# Patient Record
Sex: Male | Born: 1940 | Race: Black or African American | Hispanic: No | Smoking: Former smoker
Health system: Southern US, Community
[De-identification: ages and names within clinical notes are randomized; demographics above are authoritative.]

## PROBLEM LIST (undated history)

## (undated) DIAGNOSIS — D751 Secondary polycythemia: Secondary | ICD-10-CM

## (undated) DIAGNOSIS — I428 Other cardiomyopathies: Secondary | ICD-10-CM

## (undated) DIAGNOSIS — I639 Cerebral infarction, unspecified: Secondary | ICD-10-CM

## (undated) DIAGNOSIS — Z720 Tobacco use: Secondary | ICD-10-CM

## (undated) DIAGNOSIS — F1910 Other psychoactive substance abuse, uncomplicated: Secondary | ICD-10-CM

## (undated) DIAGNOSIS — E875 Hyperkalemia: Secondary | ICD-10-CM

## (undated) DIAGNOSIS — I519 Heart disease, unspecified: Secondary | ICD-10-CM

## (undated) DIAGNOSIS — I499 Cardiac arrhythmia, unspecified: Secondary | ICD-10-CM

## (undated) DIAGNOSIS — I340 Nonrheumatic mitral (valve) insufficiency: Secondary | ICD-10-CM

## (undated) DIAGNOSIS — J189 Pneumonia, unspecified organism: Secondary | ICD-10-CM

## (undated) DIAGNOSIS — F1921 Other psychoactive substance dependence, in remission: Secondary | ICD-10-CM

## (undated) DIAGNOSIS — R9430 Abnormal result of cardiovascular function study, unspecified: Secondary | ICD-10-CM

## (undated) DIAGNOSIS — IMO0002 Reserved for concepts with insufficient information to code with codable children: Secondary | ICD-10-CM

## (undated) DIAGNOSIS — I5022 Chronic systolic (congestive) heart failure: Secondary | ICD-10-CM

## (undated) DIAGNOSIS — E8770 Fluid overload, unspecified: Secondary | ICD-10-CM

## (undated) HISTORY — DX: Secondary polycythemia: D75.1

## (undated) HISTORY — DX: Other psychoactive substance dependence, in remission: F19.21

## (undated) HISTORY — DX: Fluid overload, unspecified: E87.70

## (undated) HISTORY — DX: Chronic systolic (congestive) heart failure: I50.22

## (undated) HISTORY — PX: OTHER SURGICAL HISTORY: SHX169

## (undated) HISTORY — DX: Pneumonia, unspecified organism: J18.9

## (undated) HISTORY — PX: COLONOSCOPY: SHX174

## (undated) HISTORY — PX: HERNIA REPAIR: SHX51

## (undated) HISTORY — DX: Reserved for concepts with insufficient information to code with codable children: IMO0002

## (undated) HISTORY — DX: Other psychoactive substance abuse, uncomplicated: F19.10

## (undated) HISTORY — DX: Nonrheumatic mitral (valve) insufficiency: I34.0

## (undated) HISTORY — DX: Tobacco use: Z72.0

## (undated) HISTORY — DX: Abnormal result of cardiovascular function study, unspecified: R94.30

## (undated) HISTORY — DX: Heart disease, unspecified: I51.9

---

## 2000-04-17 DIAGNOSIS — J189 Pneumonia, unspecified organism: Secondary | ICD-10-CM

## 2000-04-17 HISTORY — DX: Pneumonia, unspecified organism: J18.9

## 2000-04-28 ENCOUNTER — Emergency Department (HOSPITAL_COMMUNITY): Admission: EM | Admit: 2000-04-28 | Discharge: 2000-04-28 | Payer: Self-pay | Admitting: Emergency Medicine

## 2000-04-28 ENCOUNTER — Encounter: Payer: Self-pay | Admitting: Emergency Medicine

## 2000-05-05 ENCOUNTER — Encounter: Payer: Self-pay | Admitting: Emergency Medicine

## 2000-05-05 ENCOUNTER — Inpatient Hospital Stay (HOSPITAL_COMMUNITY): Admission: EM | Admit: 2000-05-05 | Discharge: 2000-05-07 | Payer: Self-pay | Admitting: Emergency Medicine

## 2000-05-29 ENCOUNTER — Encounter: Admission: RE | Admit: 2000-05-29 | Discharge: 2000-05-29 | Payer: Self-pay | Admitting: Family Medicine

## 2000-06-24 ENCOUNTER — Encounter: Payer: Self-pay | Admitting: General Surgery

## 2000-06-26 ENCOUNTER — Encounter (INDEPENDENT_AMBULATORY_CARE_PROVIDER_SITE_OTHER): Payer: Self-pay | Admitting: Specialist

## 2000-06-26 ENCOUNTER — Observation Stay (HOSPITAL_COMMUNITY): Admission: RE | Admit: 2000-06-26 | Discharge: 2000-06-27 | Payer: Self-pay | Admitting: General Surgery

## 2000-08-27 ENCOUNTER — Ambulatory Visit (HOSPITAL_COMMUNITY): Admission: RE | Admit: 2000-08-27 | Discharge: 2000-08-27 | Payer: Self-pay | Admitting: *Deleted

## 2002-08-06 ENCOUNTER — Encounter: Payer: Self-pay | Admitting: Family Medicine

## 2002-08-06 ENCOUNTER — Ambulatory Visit (HOSPITAL_COMMUNITY): Admission: RE | Admit: 2002-08-06 | Discharge: 2002-08-06 | Payer: Self-pay | Admitting: Family Medicine

## 2003-05-07 ENCOUNTER — Inpatient Hospital Stay (HOSPITAL_COMMUNITY): Admission: EM | Admit: 2003-05-07 | Discharge: 2003-05-08 | Payer: Self-pay | Admitting: Emergency Medicine

## 2003-06-08 ENCOUNTER — Ambulatory Visit (HOSPITAL_COMMUNITY): Admission: RE | Admit: 2003-06-08 | Discharge: 2003-06-08 | Payer: Self-pay | Admitting: Internal Medicine

## 2004-02-01 ENCOUNTER — Encounter: Admission: RE | Admit: 2004-02-01 | Discharge: 2004-02-01 | Payer: Self-pay | Admitting: General Surgery

## 2004-02-02 ENCOUNTER — Ambulatory Visit (HOSPITAL_BASED_OUTPATIENT_CLINIC_OR_DEPARTMENT_OTHER): Admission: RE | Admit: 2004-02-02 | Discharge: 2004-02-02 | Payer: Self-pay | Admitting: General Surgery

## 2004-02-02 ENCOUNTER — Ambulatory Visit (HOSPITAL_COMMUNITY): Admission: RE | Admit: 2004-02-02 | Discharge: 2004-02-02 | Payer: Self-pay | Admitting: General Surgery

## 2004-02-21 ENCOUNTER — Ambulatory Visit: Payer: Self-pay | Admitting: *Deleted

## 2004-02-27 ENCOUNTER — Ambulatory Visit: Payer: Self-pay | Admitting: Nurse Practitioner

## 2004-04-23 ENCOUNTER — Ambulatory Visit: Payer: Self-pay | Admitting: Nurse Practitioner

## 2004-11-28 ENCOUNTER — Ambulatory Visit: Payer: Self-pay | Admitting: Cardiology

## 2004-12-24 ENCOUNTER — Ambulatory Visit: Payer: Self-pay

## 2005-01-01 ENCOUNTER — Ambulatory Visit: Payer: Self-pay | Admitting: Cardiology

## 2005-01-16 ENCOUNTER — Ambulatory Visit: Payer: Self-pay | Admitting: Internal Medicine

## 2005-02-04 ENCOUNTER — Ambulatory Visit: Payer: Self-pay | Admitting: Cardiology

## 2005-03-13 ENCOUNTER — Ambulatory Visit: Payer: Self-pay | Admitting: Cardiology

## 2005-04-11 ENCOUNTER — Ambulatory Visit: Payer: Self-pay | Admitting: Cardiology

## 2005-07-11 ENCOUNTER — Ambulatory Visit: Payer: Self-pay | Admitting: Cardiology

## 2005-11-07 ENCOUNTER — Ambulatory Visit: Payer: Self-pay | Admitting: Cardiology

## 2005-12-16 ENCOUNTER — Encounter: Payer: Self-pay | Admitting: Cardiology

## 2005-12-16 ENCOUNTER — Ambulatory Visit: Payer: Self-pay

## 2006-01-15 ENCOUNTER — Ambulatory Visit: Payer: Self-pay | Admitting: Cardiology

## 2006-02-07 ENCOUNTER — Ambulatory Visit: Payer: Self-pay | Admitting: Internal Medicine

## 2006-07-17 ENCOUNTER — Encounter: Admission: RE | Admit: 2006-07-17 | Discharge: 2006-07-17 | Payer: Self-pay | Admitting: Internal Medicine

## 2006-07-22 ENCOUNTER — Ambulatory Visit: Payer: Self-pay | Admitting: Internal Medicine

## 2006-07-28 ENCOUNTER — Ambulatory Visit: Payer: Self-pay | Admitting: Cardiology

## 2006-08-26 ENCOUNTER — Encounter (INDEPENDENT_AMBULATORY_CARE_PROVIDER_SITE_OTHER): Payer: Self-pay | Admitting: Specialist

## 2006-08-26 ENCOUNTER — Ambulatory Visit: Payer: Self-pay | Admitting: Internal Medicine

## 2006-10-19 ENCOUNTER — Inpatient Hospital Stay (HOSPITAL_COMMUNITY): Admission: EM | Admit: 2006-10-19 | Discharge: 2006-10-21 | Payer: Self-pay | Admitting: Emergency Medicine

## 2006-10-19 ENCOUNTER — Ambulatory Visit: Payer: Self-pay | Admitting: Internal Medicine

## 2007-01-16 ENCOUNTER — Ambulatory Visit: Payer: Self-pay | Admitting: Hematology & Oncology

## 2007-02-12 LAB — CBC WITH DIFFERENTIAL/PLATELET
BASO%: 0.6 % (ref 0.0–2.0)
Basophils Absolute: 0 10*3/uL (ref 0.0–0.1)
EOS%: 0.9 % (ref 0.0–7.0)
Eosinophils Absolute: 0 10*3/uL (ref 0.0–0.5)
HCT: 54.7 % — ABNORMAL HIGH (ref 38.7–49.9)
HGB: 18.8 g/dL — ABNORMAL HIGH (ref 13.0–17.1)
LYMPH%: 32.9 % (ref 14.0–48.0)
MCH: 29.6 pg (ref 28.0–33.4)
MCHC: 34.4 g/dL (ref 32.0–35.9)
MCV: 86.1 fL (ref 81.6–98.0)
MONO#: 0.7 10*3/uL (ref 0.1–0.9)
MONO%: 15.2 % — ABNORMAL HIGH (ref 0.0–13.0)
NEUT#: 2.2 10*3/uL (ref 1.5–6.5)
NEUT%: 50.4 % (ref 40.0–75.0)
Platelets: 165 10*3/uL (ref 145–400)
RBC: 6.36 10*6/uL — ABNORMAL HIGH (ref 4.20–5.71)
RDW: 15.7 % — ABNORMAL HIGH (ref 11.2–14.6)
WBC: 4.4 10*3/uL (ref 4.0–10.0)
lymph#: 1.5 10*3/uL (ref 0.9–3.3)

## 2007-02-12 LAB — CHCC SMEAR

## 2007-02-12 LAB — COMPREHENSIVE METABOLIC PANEL
BUN: 15 mg/dL (ref 6–23)
CO2: 26 mEq/L (ref 19–32)
Calcium: 9.4 mg/dL (ref 8.4–10.5)
Chloride: 103 mEq/L (ref 96–112)
Creatinine, Ser: 1.17 mg/dL (ref 0.40–1.50)
Glucose, Bld: 89 mg/dL (ref 70–99)

## 2007-02-18 LAB — JAK2 GENOTYPR

## 2007-02-18 LAB — HEMOGLOBINOPATHY EVALUATION
Hemoglobin Other: 0 % (ref 0.0–0.0)
Hgb A2 Quant: 2.4 % (ref 2.2–3.2)
Hgb A: 97.6 % (ref 96.8–97.8)
Hgb F Quant: 0 % (ref 0.0–2.0)
Hgb S Quant: 0 % (ref 0.0–0.0)

## 2007-02-18 LAB — VITAMIN B12: Vitamin B-12: 390 pg/mL (ref 211–911)

## 2007-02-18 LAB — FERRITIN: Ferritin: 359 ng/mL — ABNORMAL HIGH (ref 22–322)

## 2007-02-18 LAB — ERYTHROPOIETIN: Erythropoietin: 30.9 m[IU]/mL (ref 2.6–34.0)

## 2007-02-19 LAB — CBC WITH DIFFERENTIAL/PLATELET
Basophils Absolute: 0 10*3/uL (ref 0.0–0.1)
HCT: 53.1 % — ABNORMAL HIGH (ref 38.7–49.9)
HGB: 18.1 g/dL — ABNORMAL HIGH (ref 13.0–17.1)
MONO#: 0.8 10*3/uL (ref 0.1–0.9)
NEUT#: 2.2 10*3/uL (ref 1.5–6.5)
NEUT%: 47.5 % (ref 40.0–75.0)
RDW: 15.3 % — ABNORMAL HIGH (ref 11.2–14.6)
WBC: 4.6 10*3/uL (ref 4.0–10.0)
lymph#: 1.6 10*3/uL (ref 0.9–3.3)

## 2007-02-26 LAB — CBC WITH DIFFERENTIAL/PLATELET
Basophils Absolute: 0 10*3/uL (ref 0.0–0.1)
EOS%: 0.6 % (ref 0.0–7.0)
Eosinophils Absolute: 0 10*3/uL (ref 0.0–0.5)
HCT: 49.8 % (ref 38.7–49.9)
HGB: 17.2 g/dL — ABNORMAL HIGH (ref 13.0–17.1)
MCH: 29.9 pg (ref 28.0–33.4)
MCV: 86.8 fL (ref 81.6–98.0)
MONO%: 16.9 % — ABNORMAL HIGH (ref 0.0–13.0)
NEUT#: 1.9 10*3/uL (ref 1.5–6.5)
NEUT%: 45.8 % (ref 40.0–75.0)
lymph#: 1.5 10*3/uL (ref 0.9–3.3)

## 2007-03-11 ENCOUNTER — Ambulatory Visit: Payer: Self-pay | Admitting: Hematology & Oncology

## 2007-03-13 LAB — CBC WITH DIFFERENTIAL/PLATELET
Basophils Absolute: 0 10*3/uL (ref 0.0–0.1)
EOS%: 1.1 % (ref 0.0–7.0)
Eosinophils Absolute: 0.1 10*3/uL (ref 0.0–0.5)
HGB: 17.5 g/dL — ABNORMAL HIGH (ref 13.0–17.1)
LYMPH%: 35.8 % (ref 14.0–48.0)
MCH: 29.2 pg (ref 28.0–33.4)
MCV: 86.9 fL (ref 81.6–98.0)
MONO%: 19.9 % — ABNORMAL HIGH (ref 0.0–13.0)
NEUT#: 2.1 10*3/uL (ref 1.5–6.5)
Platelets: 187 10*3/uL (ref 145–400)
RDW: 15.8 % — ABNORMAL HIGH (ref 11.2–14.6)

## 2007-05-10 ENCOUNTER — Ambulatory Visit: Payer: Self-pay | Admitting: Hematology & Oncology

## 2007-05-13 LAB — COMPREHENSIVE METABOLIC PANEL
AST: 25 U/L (ref 0–37)
Albumin: 4.2 g/dL (ref 3.5–5.2)
Alkaline Phosphatase: 65 U/L (ref 39–117)
Glucose, Bld: 83 mg/dL (ref 70–99)
Potassium: 4.7 mEq/L (ref 3.5–5.3)
Sodium: 140 mEq/L (ref 135–145)
Total Bilirubin: 0.8 mg/dL (ref 0.3–1.2)
Total Protein: 7.3 g/dL (ref 6.0–8.3)

## 2007-05-13 LAB — CBC WITH DIFFERENTIAL/PLATELET
BASO%: 0.6 % (ref 0.0–2.0)
EOS%: 0.8 % (ref 0.0–7.0)
LYMPH%: 34.2 % (ref 14.0–48.0)
MCH: 29.2 pg (ref 28.0–33.4)
MCHC: 33.5 g/dL (ref 32.0–35.9)
MCV: 87.2 fL (ref 81.6–98.0)
MONO%: 14.9 % — ABNORMAL HIGH (ref 0.0–13.0)
NEUT#: 2.3 10*3/uL (ref 1.5–6.5)
Platelets: 211 10*3/uL (ref 145–400)
RBC: 6.56 10*6/uL — ABNORMAL HIGH (ref 4.20–5.71)
RDW: 16.5 % — ABNORMAL HIGH (ref 11.2–14.6)

## 2007-05-13 LAB — FERRITIN: Ferritin: 117 ng/mL (ref 22–322)

## 2007-11-26 ENCOUNTER — Ambulatory Visit: Payer: Self-pay | Admitting: Cardiology

## 2007-12-04 ENCOUNTER — Ambulatory Visit: Payer: Self-pay | Admitting: Hematology & Oncology

## 2007-12-04 LAB — CBC WITH DIFFERENTIAL/PLATELET
BASO%: 1.4 % (ref 0.0–2.0)
Basophils Absolute: 0.1 10*3/uL (ref 0.0–0.1)
EOS%: 0.7 % (ref 0.0–7.0)
Eosinophils Absolute: 0 10*3/uL (ref 0.0–0.5)
HCT: 55.4 % — ABNORMAL HIGH (ref 38.7–49.9)
HGB: 18.5 g/dL — ABNORMAL HIGH (ref 13.0–17.1)
LYMPH%: 32.5 % (ref 14.0–48.0)
MCH: 28.9 pg (ref 28.0–33.4)
MCHC: 33.4 g/dL (ref 32.0–35.9)
MCV: 86.5 fL (ref 81.6–98.0)
MONO#: 0.7 10*3/uL (ref 0.1–0.9)
MONO%: 15.9 % — ABNORMAL HIGH (ref 0.0–13.0)
NEUT#: 2.3 10*3/uL (ref 1.5–6.5)
NEUT%: 49.5 % (ref 40.0–75.0)
Platelets: 161 10*3/uL (ref 145–400)
RBC: 6.41 10*6/uL — ABNORMAL HIGH (ref 4.20–5.71)
RDW: 16.2 % — ABNORMAL HIGH (ref 11.2–14.6)
WBC: 4.6 10*3/uL (ref 4.0–10.0)
lymph#: 1.5 10*3/uL (ref 0.9–3.3)

## 2007-12-04 LAB — COMPREHENSIVE METABOLIC PANEL
ALT: 17 U/L (ref 0–53)
AST: 19 U/L (ref 0–37)
Albumin: 3.9 g/dL (ref 3.5–5.2)
Alkaline Phosphatase: 56 U/L (ref 39–117)
BUN: 13 mg/dL (ref 6–23)
CO2: 25 mEq/L (ref 19–32)
Calcium: 9.6 mg/dL (ref 8.4–10.5)
Chloride: 103 mEq/L (ref 96–112)
Creatinine, Ser: 0.91 mg/dL (ref 0.40–1.50)
Glucose, Bld: 99 mg/dL (ref 70–99)
Potassium: 4.1 mEq/L (ref 3.5–5.3)
Sodium: 139 mEq/L (ref 135–145)
Total Bilirubin: 0.7 mg/dL (ref 0.3–1.2)
Total Protein: 6.9 g/dL (ref 6.0–8.3)

## 2007-12-04 LAB — CHCC SMEAR

## 2007-12-07 ENCOUNTER — Encounter: Payer: Self-pay | Admitting: Cardiology

## 2007-12-07 ENCOUNTER — Ambulatory Visit: Payer: Self-pay

## 2008-01-25 ENCOUNTER — Ambulatory Visit: Payer: Self-pay | Admitting: Hematology

## 2008-04-24 ENCOUNTER — Ambulatory Visit: Payer: Self-pay | Admitting: Cardiovascular Disease

## 2008-04-24 ENCOUNTER — Observation Stay (HOSPITAL_COMMUNITY): Admission: EM | Admit: 2008-04-24 | Discharge: 2008-04-25 | Payer: Self-pay | Admitting: Emergency Medicine

## 2008-05-09 ENCOUNTER — Ambulatory Visit: Payer: Self-pay | Admitting: Cardiology

## 2008-11-08 ENCOUNTER — Encounter: Payer: Self-pay | Admitting: Cardiology

## 2008-11-09 ENCOUNTER — Encounter: Payer: Self-pay | Admitting: Cardiology

## 2008-12-15 ENCOUNTER — Telehealth: Payer: Self-pay | Admitting: Cardiology

## 2009-01-10 ENCOUNTER — Encounter (INDEPENDENT_AMBULATORY_CARE_PROVIDER_SITE_OTHER): Payer: Self-pay | Admitting: *Deleted

## 2009-02-21 ENCOUNTER — Encounter: Payer: Self-pay | Admitting: Cardiology

## 2009-04-13 ENCOUNTER — Encounter: Payer: Self-pay | Admitting: Cardiology

## 2009-04-14 ENCOUNTER — Ambulatory Visit: Payer: Self-pay | Admitting: Cardiology

## 2009-04-14 DIAGNOSIS — D751 Secondary polycythemia: Secondary | ICD-10-CM

## 2009-08-09 ENCOUNTER — Encounter (INDEPENDENT_AMBULATORY_CARE_PROVIDER_SITE_OTHER): Payer: Self-pay | Admitting: *Deleted

## 2009-09-27 ENCOUNTER — Ambulatory Visit: Payer: Self-pay | Admitting: Cardiology

## 2010-04-19 ENCOUNTER — Ambulatory Visit: Payer: Self-pay | Admitting: Cardiology

## 2010-04-19 ENCOUNTER — Encounter: Payer: Self-pay | Admitting: Cardiology

## 2010-07-17 NOTE — Assessment & Plan Note (Signed)
Summary: f52m   Visit Type:  Follow-up Primary Provider:  Kari Baars, MD  CC:  cardiomyopathy.  History of Present Illness: The patient is seen for followup of cardiomyopathy.  We know from the past he has severe left ventricular dysfunction.  Despite this he functions well and he feels well.  Over time I have made it clear to him that ICD placement would be recommended.  He has not wanted.  He has been willing to take most of the medications recommended.  I considered adding spironolactone.  In his case I have chosen not to because he does not want extra medicines added unless I'm sure that he needs them.  He is not having any significant symptoms.  Current Medications (verified): 1)  Carvedilol 25 Mg Tabs (Carvedilol) .... Take One Tablet By Mouth Twice A Day 2)  Fosinopril Sodium 40 Mg Tabs (Fosinopril Sodium) .... Take 1 Tablet By Mouth Once A Day 3)  Crestor 40 Mg Tabs (Rosuvastatin Calcium) .... Take One Tablet By Mouth Daily. 4)  Veramyst 27.5 Mcg/spray Susp (Fluticasone Furoate) .... Uad 5)  Aspirin 81 Mg Tbec (Aspirin) .... Take One Tablet By Mouth Daily 6)  Furosemide 40 Mg Tabs (Furosemide) .... 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  All other systems are reviewed and are negative.  Vital Signs:  Patient profile:   70 year old male Height:      71 inches Weight:      160 pounds BMI:     22.40 Pulse rate:   65 / minute BP  sitting:   122 / 70  (left arm) Cuff size:   regular  Vitals Entered By: Hardin Negus, RMA (September 27, 2009 1:58 PM)  Physical Exam  General:  patient is stable. Eyes:  no xanthelasma. Neck:  no jugular venous distention. Lungs:  lungs are clear respiratory effort is unlabored. Heart:  cardiac exam reveals S1-S2.  No clicks or significant murmurs. Abdomen:  abdomen is soft. Extremities:  no peripheral edema. Psych:  patient is oriented to person time and place.  Affect is normal.   Impression & Recommendations:  Problem # 1:  TOBACCO ABUSE (ICD-305.1) The patient continues to smoke.  I have counseled him and encouraged him to stop.  Problem # 2:  * RV DYSFUNCTION There is history of right ventricular dysfunction.  He does not have any physical signs related to this.  Problem # 3:  CARDIOMYOPATHY (ICD-425.4)  His updated medication list for this problem includes:    Carvedilol 25 Mg Tabs (Carvedilol) .Marland Kitchen... Take one tablet by mouth twice a day    Fosinopril Sodium 40 Mg Tabs (Fosinopril sodium) .Marland Kitchen... Take 1 tablet by mouth once a day    Aspirin 81 Mg Tbec (Aspirin) .Marland Kitchen... Take one tablet by mouth daily    Furosemide 40 Mg Tabs (Furosemide) .Marland Kitchen... Take one tablet by mouth two times a day The patient has significant cardiomyopathy.  He is on a good dose of carvedilol and ACE inhibitor.  He is on  furosemide and his volume remained stable.  There is no indication for digoxin.  ICD is recommended but he is not interested.  Spironolactone could clearly be considered but he is not interested in any extra meds.  He is doing very well.  No further workup at this time.  Patient Instructions: 1)  Follow up in 6 months

## 2010-07-17 NOTE — Letter (Signed)
Summary: Appointment - Reminder 2  [facility] [employer], Main Office  [address]   [place], [place] [address]   Phone: [phone]  Fax: [phone]     August 09, 2009 MRN: [id number]   [facility] [address] [place], [place]  [address]   Dear [name],  Our records indicate that it is time to schedule a follow-up appointment with Dr. Myrtis Ser. It is very important that we reach you to schedule this appointment. We look forward to participating in your health care needs. Please contact us at the number listed above at your earliest convenience to schedule your appointment.  If you are unable to make an appointment at this time, give [place] a call so we can update our records.     Sincerely,   Migdalia Dk [facility] Scheduling Team

## 2010-07-17 NOTE — Assessment & Plan Note (Signed)
Summary: f20m   Visit Type:  Follow-up Primary Tracey Stewart:  Kari Baars, MD  CC:  cardiomyopathy.  History of Present Illness: The patient is seen for followup of cardiomyopathy.  He has severe left ventricular dysfunction.  Fortunately he remains very stable.  He has no significant symptoms of heart failure.  He has no palpitations.  There's been no syncope or presyncope.  Current Medications (verified): 1)  Carvedilol 25 Mg Tabs (Carvedilol) .... Take One Tablet By Mouth Twice A Day 2)  Fosinopril Sodium 40 Mg Tabs (Fosinopril Sodium) .... Take 1 Tablet By Mouth Once A Day 3)  Crestor 40 Mg Tabs (Rosuvastatin Calcium) .... Take One Tablet By Mouth Daily. 4)  Veramyst 27.5 Mcg/spray Susp (Fluticasone Furoate) .... Uad 5)  Aspirin 81 Mg Tbec (Aspirin) .... Take One Tablet By Mouth Daily 6)  Furosemide 40 Mg Tabs (Furosemide) .... 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  Review of Systems       Patient denies fever, chills, headache, sweats, rash, change in vision, change in hearing, chest pain, cough, nausea vomiting, urinary symptoms.  All other systems are reviewed and are negative.  Vital Signs:  Patient profile:   70 year old male Height:      71 inches Weight:      156 pounds BMI:     21.84 Pulse rate:   68 / minute BP sitting:   108 / 64  (left arm) Cuff size:   regular  Vitals Entered By: Hardin Negus, RMA (April 19, 2010 9:49 AM)  Physical Exam  General:  patient is stable. Eyes:  no xanthelasma. Neck:  no jugular venous distention. Lungs:  lungs are clear.  Respiratory effort is not labored. Heart:  cardiac exam reveals S1-S2  no clicks or murmurs. Abdomen:  abdomen is soft. Extremities:  no peripheral edema. Psych:  patient is oriented to person time and place.  Affect is normal.   Impression & Recommendations:  Problem # 1:  TOBACCO ABUSE (ICD-305.1) I counseled the patient to stop.  Problem # 2:  CARDIOMYOPATHY (ICD-425.4)  His updated medication list for this problem includes:    Carvedilol 25 Mg Tabs (Carvedilol) .Marland Kitchen... Take one tablet by mouth twice a day    Fosinopril Sodium 40 Mg Tabs (Fosinopril sodium) .Marland Kitchen... Take 1 tablet by mouth once a day    Aspirin 81 Mg Tbec (Aspirin) .Marland Kitchen... Take one tablet by mouth daily    Furosemide 40 Mg Tabs (Furosemide) .Marland Kitchen... Take one tablet by mouth two times a day  Orders: EKG w/ Interpretation (93000) EKG is done today and reviewed by me.  There is normal sinus rhythm.  There is no significant change since the prior tracing.  I have compared them.  There are diffuse nonspecific ST-T wave changes.  The patient continues on an ACE inhibitor and beta blocker at the doses.  He is on a diuretic.  His labs are followed by Dr.Shaw.  I have chosen not to repeat labs.  I've mentioned in the past I considered spironolactone.  The patient has refused an ICD in the past.  He also wants to limit the medications to those that are absolutely indicated.  We may still talk to him about using  spironolactone in the future but not started today.  No change in therapy.  Problem # 3:  FLUID RETENTION (ICD-276.6) As volume status is stable.  No change in therapy.  Patient Instructions: 1)  Your physician wants you to follow-up in: 6 months.  You will receive a reminder letter in the mail two months in advance. If you don't receive a letter, please call our office to schedule the follow-up appointment.

## 2010-10-17 ENCOUNTER — Encounter: Payer: Self-pay | Admitting: Cardiology

## 2010-10-23 ENCOUNTER — Encounter: Payer: Self-pay | Admitting: Cardiology

## 2010-10-23 DIAGNOSIS — Z720 Tobacco use: Secondary | ICD-10-CM | POA: Insufficient documentation

## 2010-10-23 DIAGNOSIS — I429 Cardiomyopathy, unspecified: Secondary | ICD-10-CM | POA: Insufficient documentation

## 2010-10-23 DIAGNOSIS — I340 Nonrheumatic mitral (valve) insufficiency: Secondary | ICD-10-CM | POA: Insufficient documentation

## 2010-10-23 DIAGNOSIS — I519 Heart disease, unspecified: Secondary | ICD-10-CM | POA: Insufficient documentation

## 2010-10-23 DIAGNOSIS — R9430 Abnormal result of cardiovascular function study, unspecified: Secondary | ICD-10-CM | POA: Insufficient documentation

## 2010-10-24 ENCOUNTER — Ambulatory Visit (INDEPENDENT_AMBULATORY_CARE_PROVIDER_SITE_OTHER): Payer: Medicare Other | Admitting: Cardiology

## 2010-10-24 ENCOUNTER — Encounter: Payer: Self-pay | Admitting: Cardiology

## 2010-10-24 DIAGNOSIS — I428 Other cardiomyopathies: Secondary | ICD-10-CM

## 2010-10-24 DIAGNOSIS — E8770 Fluid overload, unspecified: Secondary | ICD-10-CM

## 2010-10-24 DIAGNOSIS — F17200 Nicotine dependence, unspecified, uncomplicated: Secondary | ICD-10-CM

## 2010-10-24 DIAGNOSIS — E8779 Other fluid overload: Secondary | ICD-10-CM

## 2010-10-24 DIAGNOSIS — Z720 Tobacco use: Secondary | ICD-10-CM

## 2010-10-24 NOTE — Assessment & Plan Note (Signed)
The patient remains remarkably stable.  No change in therapy at this time.

## 2010-10-24 NOTE — Assessment & Plan Note (Signed)
Since the patient's last visit he has stopped smoking.  I congratulated him for this.

## 2010-10-24 NOTE — Progress Notes (Signed)
HPI Patient is seen for followup cardiomyopathy.  He remains remarkably stable.  I saw him last November, 2011.  He has severe left ventricular dysfunction.  He is on appropriate medications.  I have spoken with him many times in the past and he has refused ICD.  He also has some right ventricular dysfunction.  He is not having chest pain shortness of breath or symptoms of fluid overload. No Known Allergies  Current Outpatient Prescriptions  Medication Sig Dispense Refill  . aspirin 81 MG tablet Take 81 mg by mouth daily.        . carvedilol (COREG) 25 MG tablet Take 25 mg by mouth 2 (two) times daily.        . fluticasone (VERAMYST) 27.5 MCG/SPRAY nasal spray 2 sprays by Nasal route daily.        . fosinopril (MONOPRIL) 40 MG tablet Take 40 mg by mouth daily.        . furosemide (LASIX) 40 MG tablet Take 40 mg by mouth 2 (two) times daily.        . rosuvastatin (CRESTOR) 40 MG tablet Take 40 mg by mouth daily.          History   Social History  . Marital Status: Widowed    Spouse Name: N/A    Number of Children: 3  . 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 Smoking status: Former Smoker    Types: Cigarettes    Quit date: 06/17/2010  . Smokeless tobacco: Never Used  . Alcohol Use: 2.0 oz/week    4 drink(s) per week  . Drug Use: Yes     hx of clean for 20+ years  . Sexually Active: Not on file   Other Topics Concern  . Not on file   Social History Narrative  . No narrative on file    Family History  Problem Relation Age of Onset  . Diabetes Mother     Past Medical History  Diagnosis Date  . Pneumonia 11/01  . Cardiomyopathy     refused cath and refused icd/echo.Marland KitchenMarland Kitchen6/2009 10%  LV .Marland Kitchen.27mm+  . Volume overload   . H/O: drug dependency     In the past., clean for greater than 20 years  . Ejection fraction < 50%     EF 10%, echo, June, 2009, LV 75 mm  . Mitral regurgitation     Mild by history  . Right  ventricular dysfunction     Mild to moderate, echo, June, 2009  . Tobacco abuse     No past surgical history on file.  ROS  Patient denies fever, chills, headache, sweats, rash, change in vision, change in hearing, chest pain, cough, nausea vomiting, urinary symptoms.  All other systems are reviewed and are negative.  PHYSICAL EXAM Patient is oriented to person time and place.  Affect is normal.  Head is atraumatic.  No jugular venous distention.  Lungs are clear.  Respiratory effort is nonlabored.  Cardiac exam reveals muscle and S2.  No clicks or significant murmurs.  The abdomen is soft.  There is no peripheral edema. Filed Vitals:   10/24/10 0920  BP: 111/75  Pulse: 69  Height: 5\' 11"  (1.803 m)  Weight: 164 lb (74.39 kg)    EKG   EKG is done today and reviewed by me.  He has old interventricular conduction delay old increased voltage and old diffuse ST changes.  There  is no significant change.   ASSESSMENT & PLAN

## 2010-10-24 NOTE — Patient Instructions (Signed)
Your physician wants you to follow-up in:  6 months. You will receive a reminder letter in the mail two months in advance. If you don't receive a letter, please call our office to schedule the follow-up appointment.   

## 2010-10-24 NOTE — Assessment & Plan Note (Signed)
Fluid status is very stable.  No change in therapy.

## 2010-10-30 ENCOUNTER — Other Ambulatory Visit: Payer: Self-pay | Admitting: Cardiology

## 2010-10-30 NOTE — H&P (Signed)
NAMEAHMAR, PICKRELL NO.:  [id number]   MEDICAL RECORD NO.:  [id number]          PATIENT TYPE:  INP   LOCATION:  1824                         FACILITY:  [facility]   PHYSICIAN:  Luis Abed, MD, FACCDATE OF BIRTH:  1940/08/20   DATE OF ADMISSION:  04/24/2008  DATE OF DISCHARGE:                              HISTORY & PHYSICAL   CARDIOLOGIST:  Luis Abed, MD, [facility]   CHIEF COMPLAINT:  Chest pain.   HISTORY OF PRESENT ILLNESS:  A 70 year old man with cardiomyopathy,  ejection fraction of 5-10%, presumed nonischemic but he has never had  cardiac cath.  The patient also has COPD and ongoing tobacco abuse as  well as history of polysubstance abuse.  The patient comes to the ED  with 2-day history of right-sided chest pain that started at rest 1 day  prior to presentation.  He describes his pain as sharp, 5/10 pain,  nonradiating, that is felt like gas.  He mentioned that he felt the  pain only when on movement of his chest, it was nonpleuritic.  There is  no history of similar pain in the past.  No history of DVT or pulmonary  embolism, no hemoptysis, no fever.  The patient, however, mentions of  having cough with whitish sputum but no shortness of breath,  palpitations, or leg swelling, or immobility, or recent travel.   REVIEW OF SYSTEMS:  Positive for loose stool without blood for 2 days.   ALLERGIES:  No known history of allergies.   PAST MEDICAL HISTORY:  1. Nonischemic cardiomyopathy, ejection fraction 5-10% from June 2009.  2. Mild mitral regurgitation.  3. History of nonsustained VT, the patient awaiting defibrillator      placement.  4. COPD.  5. Ongoing tobacco abuse.  6. History of polysubstance abuse.  7. Polycythemia.  8. Hyperlipidemia.  9. Impaired glucose tolerance.   CURRENT MEDICATIONS:  1. Crestor 20 mg once daily.  2. Fexofenadine 180 mg once daily.  3. Carvedilol 25 mg twice a day.  4. Furosemide 40 mg once daily.  5. Fosinopril  20 mg once daily.  6. Fluticasone spray 50 mcg at night time.  7. Loratadine over-the-counter.  8. Aspirin 81 mg.  9. Tylenol as needed.   SOCIAL HISTORY:  The patient lives alone.  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  He  smokes half pack per day.  Drinks 2-3 beers per week and occasionally  smokes marijuana.   FAMILY HISTORY:  The patient denied any history of heart disease in the  family, but per previous notes there is a mention of heart disease in  the family.   REVIEW OF SYSTEMS:  CONSTITUTIONAL:  Negative for fever, chills, sweats,  or weight loss.  HEAD, EYES, ENT: Negative for headache or dizziness.  SKIN:  Negative.  CARDIOPULMONARY:  Positive for chest pain, orthopnea,  PND, and cough.  Negative for shortness of breath.  GENITOURINARY:  Negative.  NEUROPSYCHIATRIC:  Negative.  MUSCULOSKELETAL:  Negative.  GI:  Positive for diarrhea and negative for nausea or vomiting.  ENDOCRINE:  Negative.   PHYSICAL EXAMINATION:  VITAL  SIGNS:  Temperature 97.6, pulse 89,  respiratory rate 19, blood pressure 146/97, and saturation 98% on room  air.  GENERAL:  NAD.  HEAD, EYES, ENT:  Normocephalic, atraumatic.  PERRL, EOMI, moist mucous  membranes.  Oropharynx without erythema or exudates.  NECK:  Supple without lymphadenopathy.  CARDIOVASCULAR:  Heart regular rate and rhythm with normal heart sounds  without murmur, rubs, or gallops.  LUNGS:  Clear to auscultation without wheezes, rhonchi, or rales.  SKIN:  No rash, normal turgor.  ABDOMEN:  Soft, nontender with normal bowel sounds.  EXTREMITIES:  No cyanosis, clubbing, or edema.  NEUROLOGIC:  Alert and oriented x3.  Nonfocal examination.   LABORATORY DATA:  1. X-ray:  Cardiac enlargement without acute pulmonary process.  2. Electrocardiogram:  Rate 84 per minute with first-degree heart      block, left axis deviation, normal intervals, biatrial enlargement,      left ventricular hypertrophy with repolarization abnormalities.  No      new  ischemic changes compared to EKG from May 2008.  3. Hemoglobin 21.4 and hematocrit 63.  Sodium 136, potassium 5.6,      chloride 105, bicarbonate 26, BUN 15, creatinine 1.1, and blood      glucose 85.  CK-MB 2.2.  troponin I less than 0.05.  Myoglobin      64.1.   ASSESSMENT AND PLAN:  A 70 year old man with risk factors of tobacco  abuse, hyperlipidemia, polycythemia, substance abuse, acquired  immunodeficiency syndrome, and male sex presenting with chest pain.  1. Chest pain.  Atypical, but given the significant risk factors, we      will admit the patient to telemetry and rule him out with cycled      cardiac enzymes and repeat electrocardiograms.  The patient's wells      score is 0 without hypoxia, tachycardia, or shortness of breath,      hence unlikely to be pulmonary embolism.  We will give him PPI for      possible GI component of his pain.  We do not think dissection      pneumothorax or pneumonia are the issues here.  2. Polycythemia, most likely secondary to chronic obstructive      pulmonary disease and smoking.  We will monitor his hemoglobin and      hematocrit and check CBC with differential.  3. Hyperkalemia.  This is most likely secondary to fosinopril.  We      will hold this medicine.  The patient does not have any      electrocardiographic changes secondary to hyperkalemia, hence we      will just monitor his BMET for now.  4. Cardiomyopathy.  It is clinically stable.  We will check a BNP and      continue his Lasix, Coreg, and aspirin.  As mentioned above, we      will hold his fosinopril and start him on hydralazine and nitrates      in place of fosinopril.  5. Tobacco abuse.  The patient will need smoking cessation counseling      and nicotine patch.  6. Chronic obstructive pulmonary disease, stable currently.  We will      just give him p.r.n. medications.  7. Hyperlipidemia.  We will continue his Crestor and check fasting      lipid panel in the morning.  8.  Prophylaxis.  We will place him on Protonix and heparin.      Zara Council, MD  Electronically Signed  Luis Abed, MD, [facility]  Electronically Signed    AS/MEDQ  D:  04/25/2008  T:  04/25/2008  Job:  [id number]

## 2010-10-30 NOTE — Discharge Summary (Signed)
NAMEJOANTHAN, HLAVACEK NO.:  [id number]   MEDICAL RECORD NO.:  [id number]          PATIENT TYPE:  OBV   LOCATION:  6526                         FACILITY:  [facility]   PHYSICIAN:  Doylene Canning. Ladona Ridgel, MD    DATE OF BIRTH:  [birth year]   DATE OF ADMISSION:  04/24/2008  DATE OF DISCHARGE:  04/25/2008                               DISCHARGE SUMMARY   ALLERGIES:  This patient has no known drug allergies.   FINAL DIAGNOSES:  1. Admitted with chest pain.      a.     Atypical, hurts with movement.      b.     Troponin I studies were negative x3.      c.     Electrocardiogram:  No ST-T changes.  The patient is in       sinus rhythm, first-degree atrioventricular block, left anterior       fascicular block.  2. BNP this admission was 80.   SECONDARY DIAGNOSES:  1. Nonischemic cardiomyopathy.  Ejection fraction 5-10% by      echocardiogram, June 2009.  2. History of drug addiction.  3. 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  4. Ongoing tobacco habituation.  5. Dyslipidemia.  6. Impaired glucose tolerance.  7. REFUSES CARDIOVERTER DEFIBRILLATOR.   PROCEDURES:  No procedures this admission.   BRIEF HISTORY:  Mr. Rosier is a 70 year old male.  He has a known  nonischemic cardiomyopathy.  His ejection fraction is 5-10%.  He has  never had a cardiac catheterization, however.  He does have COPD and  ongoing tobacco habituation.   He has a history of polysubstance abuse.  He comes to the emergency room  with a 2-day history of right-sided chest pain.  He states that it is  5/10 in intensity and nonradiating.  It feels like gas.  It does hurt  with movement.  He has no history of similar pain in the past.  He has  no history of DVT or pulmonary embolism.  No history of leg swelling, no  palpitations, no pre/syncope, and no recent travel.  He did have loose  stool with blood ongoing for 2 days.  The patient will be admitted.  He  will  have a CBC, chest x-ray, cardiac enzymes will be cycled, and  electrocardiogram taken as well.  He will be maintained on his home  medications.   HOSPITAL COURSE:  The patient was admitted to [facility]  through the emergency room on April 24, 2008 with provisional  diagnosis of atypical chest pain.  His enzymes have been cycled and they  are all negative x3.  Electrocardiogram shows no acute changes, sinus  rhythm, and no ST-T changes.  His chest x-ray shows cardiac enlargement.  There was no acute pulmonary process and no masses located.  His lipid  panel is total cholesterol 117, triglycerides 52, HDL cholesterol 38,  and LDL cholesterol 69.  The patient was seen by Dr. Lewayne Bunting on  hospital day #2.  Chest pain had resolved.  The  patient is discharging.  He goes home on the following medications.  1. Fosinopril 20 mg daily.  2. Fexofenadine 180 mg daily.  3. Furosemide 40 mg twice daily.  4. Crestor 20 mg daily at bedtime.  5. Carvedilol 25 mg twice daily.  6. Enteric-coated aspirin 81 mg daily.  7. Fluticasone 1 spray per nostril at bedtime.  8. Claritin 10 mg daily.  9. Tylenol ES as needed.  10.Nitroglycerin tablets 0.4 mg 1 tablet under the tongue every 5      minutes x3 doses as needed for chest pain.   He will also be given a prescription for NicoDerm transdermal patch 21  mg per 24 hours.   At this hospitalization, the following were recommended:  Protonix 40 mg daily.  This has not been continued but recommended and  in a patient with cardiomyopathy of this degree, he was also recommended  hydralazine 20 mg 3 times daily, Imdur 30 mg daily.  These will not be  continued at discharge, however, they have been recommended.  The  patient was on subcu heparin during this hospitalization.   He discharges on April 25, 2008.  He sees Dr. Myrtis Ser on Monday, May 09, 2008 at 9:15.   LABORATORY STUDIES:  His lipid panel has already been dictated.  Complete  blood count:  White cells 5.1, hemoglobin 18, hematocrit 55,  and platelets were 223.  Sodium 138, potassium 4.1, chloride 103,  carbonate 27, BUN was 11, creatinine 0.9, and glucose of 69.      Maple Mirza, PA      Doylene Canning. Ladona Ridgel, MD  Electronically Signed    GM/MEDQ  D:  04/25/2008  T:  04/25/2008  Job:  [id number]   cc:   Luis Abed, MD, [facility]

## 2010-10-30 NOTE — Assessment & Plan Note (Signed)
[facility]                            CARDIOLOGY OFFICE NOTE   NAME:CASTLEGeorgio, Hattabaugh                        MRN:          [id number]  DATE:05/09/2008                            DOB:          July 10, 1940    Mr. Bulman is here for followup.  I saw him last in June 2009.  He has a  severe dilated cardiomyopathy.  A 2-D echo was done in June 2009 to  follow up and see if there had been change in his LV function.  Unfortunately, there has been no improvement.  He has marked dilatation  of his left ventricle measuring in the range of 75 mmHg.  He has an  ejection fraction in the 10% range.  There is diffuse hypokinesis.  There is mild-to-moderate right ventricular dysfunction.   He is not having any chest pain or shortness of breath.  He has had no  syncope or presyncope.   ALLERGIES:  No known drug allergies.   MEDICATIONS:  See the flow sheet.   REVIEW OF SYSTEMS:  He is not having any GI or GU symptoms.  He has no  rashes.  There is no headache.  He has had no fevers.  Otherwise, the  review of systems is negative.   PHYSICAL EXAMINATION:  Blood pressure is 138/94 with a pulse of 91.  The  patient is oriented to person, time, and place.  Affect is normal.  HEENT reveal no xanthelasma.  He has normal extraocular motion.  There  are no carotid bruits.  There is no jugular venous distention.  Lungs  are clear.  Respiratory effort is not labored.  Cardiac exam reveals an  S1 with an S2.  There are no clicks or significant murmurs.  The abdomen  is soft.  He has no significant peripheral edema.   No labs are done today.   Problems are listed in my note of November 26, 2007.  Cardiomyopathy:  The  patient is on appropriate medications.  His blood pressure, however, is  elevated.  We will push his ACE inhibitor dose higher.  Consideration  could be given also to increasing his carvedilol dose because of his  resting heart rate.  I have once again discussed  with him the pros and  cons of having an ICD placed.  He does not want this, and I certainly  understand.  I just want to be as clear as I can with him to let him  know that it is recommended in his case.   I will see him back in 6 months for followup.     Luis Abed, MD, [facility]  Electronically Signed   JDK/MedQ  DD: 05/09/2008  DT: 05/09/2008  Job #: [id number]   cc:   Kari Baars, M.D.

## 2010-10-30 NOTE — Assessment & Plan Note (Signed)
[facility]                            CARDIOLOGY OFFICE NOTE   NAME:[name]                        MRN:          [id number]  DATE:11/26/2007                            DOB:          [birth year]    [name] is doing well.  He has not had any syncope or presyncope.  He  is not having any chest pain or shortness of breath.  He has no edema.  He has a severe cardiomyopathy.  He is well aware that we are  recommending an ICD.  He is not interested and we want to just continue  to follow him very carefully.  He is going about full activities for  him.  His last echo was done in July 2007.  I do think that it is  appropriate to reassess his LV function.  Of course, it would be  wonderful if he had significant improvement.  This will help with long-  term treatment.   PAST MEDICAL HISTORY:   ALLERGIES:  No known drug allergies.   MEDICATIONS:  Lisinopril, fexofenadine, carvedilol, furosemide, Crestor,  and aspirin.   OTHER MEDICAL PROBLEMS:  See the list below.   REVIEW OF SYSTEMS:  He is feeling well and doing well, and his review of  systems is negative.   PHYSICAL EXAMINATION:  Blood pressure is 127/84 with a pulse of 80. The  patient is oriented to person, time, and place.  Affect is normal.  HEENT:  No xanthelasma.  He has normal extraocular motion.  There are no  carotid bruits.  There is no jugular venous distention.  LUNGS:  Clear.  Respiratory effort is not labored.  CARDIAC:  S1 with an S2.  There are no clicks or significant murmurs.  ABDOMEN:  Soft.  There are no masses or bruits.  He has no peripheral edema.   EKG reveals sinus rhythm.  He does have diffuse old ST-T wave changes.   PROBLEMS:  1. History of hernia repair.  2. 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  3. History of volume overload.  This is stable.  4. Cardiomyopathy.  He is on appropriate  medications and doing well.      He has not undergone cardiac catheterization.  He is taking his      medications regularly.  He has refused an implantable cardioverter-      defibrillator up to this point.  He needs a followup 2-D echo to      reassess left ventricular function, and this will be done.  5. History of mild mitral regurgitation.  We will reassess this with      his echo.  6. Some history of right ventricular dysfunction by history.  This was      present in July 2007, and we will reassess this with a new echo.  7. Smoking history.  The patient has worked on stopping over time and      always encouraged to stop.   I am  not changing his medicines.  He does need a 2-D echo, and this will  be arranged.  We will be in touch with him with the results.  Unless  there are issues that need to be addressed further, I will see him back  in 1 year.     Paul Abed, MD, [facility]  Electronically Signed    JDK/MedQ  DD: 11/26/2007  DT: 11/27/2007  Job #: [id number]   cc:   Kari Baars, M.D.

## 2010-11-02 NOTE — Assessment & Plan Note (Signed)
[facility]                              CARDIOLOGY OFFICE NOTE   NAME:[name], Potts                        MRN:          [id number]  DATE:01/15/2006                            DOB:          1940/12/26    [name] is here for follow-up.  I had seen him last on Nov 07, 2005.  We  decided to proceed with a follow-up 2-D echocardiogram.  Because of his  clinical course and his stability I had hoped that he had left ventricular  improvement.  However, unfortunately, he continues to have very severe left  ventricular dysfunction.  His ejection fraction is in the 5-10% range.   I have discussed the results with him today.  He tells me that after further  discussions with Dr. Clelia Croft that he has decided to proceed with evaluation for  an implantable defibrillator.  I believe that this is an excellent decision.   PAST MEDICAL HISTORY:   ALLERGIES:  No known drug allergies.   MEDICATIONS:  1.  Furosemide 40 b.i.d.  2.  Lisinopril 20.  3.  Coreg 25 b.i.d.  4.  Fexofenadine 180.  5.  Budeprion XL 300 daily.  6.  Vytorin 10/40.   OTHER MEDICAL PROBLEMS:  See the list below.   REVIEW OF SYSTEMS:  Patient is really feeling fine and has no significant  complaints at this time.   His review of systems is negative.   PHYSICAL EXAMINATION:  VITAL SIGNS:  Blood pressure today is 120/70 with  pulse of 68.  LUNGS:  Clear.  Respirations are not labored.  HEENT:  No xanthelasma.  He has normal extraocular motion.  There are no  jugular venous distention.  There is no carotid bruits.  CARDIAC:  S1 with an S2.  There are no clicks or significant murmurs.  ABDOMEN:  Soft.  He has no significant peripheral edema.  No laboratories  are done today.   The patient saw Dr. Lewayne Bunting on January 16, 2005.  At that time Dr. Ladona Ridgel  did give treatment options to the patient including the placement of a  defibrillator.  [name] declined at that time.  However, [name] is  now in favor and I will set up another appointment as I feel that further  discussion with Dr. Ladona Ridgel to clarify the issue would be most appropriate.   PROBLEMS:  1.  History of hernia repair.  2.  History of multiple drug addictions in the past and he has been clean      for 18 years.  3.  History of volume overload, stable.  4.  Cardiomyopathy.  This has been treated as non-ischemic.  Cardiac      catheterization has not been done.  His beta blocker and ACE inhibitors      have been titrated.  I have not used other medications.  His ejection      fraction is severely reduced.  He will be seeing Dr. Ladona Ridgel for further      discussions.  5.  History of mild mitral regurgitation.  6.  Some mild right ventricular dysfunction by history.  This remained the      same on his recent echocardiogram of December 16, 2005.  7.  Smoking history.  The patient has worked on stopping over time.   Once again I have considered other medications for the patient.  I do not  believe there is any indication at this time for other medicines.  However,  small dose aspirin could be used.  He will be seeing Dr. Ladona Ridgel for further  evaluation and hopefully the placement of an ICD.                               Luis Abed, MD, [facility]    JDK/MedQ  DD:  01/15/2006  DT:  01/15/2006  Job #:  [id number]   cc:   Kari Baars, MD

## 2010-11-02 NOTE — Discharge Summary (Signed)
[place]  Patient:    [name], [name]                        MRN: [id number] Adm. Date:  [id number] Disc. Date: [id number] Attending:  Edwyna Perfect Dictator:   Kevin Fenton, M.D.                           Discharge Summary  DATE OF BIRTH:  [birth year]  ADMITTING DIAGNOSIS: 1. Shortness of breath. 2. PE versus pneumonia versus congestive heart failure.  DISCHARGE DIAGNOSES: 1. Community acquired pneumonia. 2. Congestive heart failure. 3. 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  DISCHARGE MEDICATIONS: 1. Altace 2.5 mg p.o. b.i.d. 2. Tequin 400 mg one tab p.o. q.d. x 7 days. 3. Prednisone taper 60 mg a day x 3 days, then 40 mg a day x 3 days, then 20    mg a day x 3 days, then 10 mg a day x 3 days. 4. Albuterol MDI two puffs q.4h. as needed.  Patient will be following with me at the family practice clinic.  At the time of discharge summary I do not have that appointment available just yet.  HOSPITAL COURSE:  Patients chief complaint on admission was shortness of breath.  This is a 70 year old African-American male who presented with a two week history of cough productive of white phlegm.  He was seen at the [facility] ER on November 12 with similar complaints.  Was diagnosed with pneumonia. Was given azithromycin for five days which he finished the Friday prior to admission and he felt better.  However, patient was watching T.V. on the night of admission with sudden onset of shortness of breath and diaphoresis, but no chest pain, subjective fever x 3 weeks, but no chills, weight loss subjectively since his clothes are looser, but no nausea, vomiting, or diarrhea.  He has no pleuritic chest pain.  He has had decreased appetite for the last three to four weeks.  PAST MEDICAL HISTORY:  Unclear at the time of admission, only that he had been admitted at the [facility] several years ago  with heart trouble.  Once the records were obtained it was found that patient had an ejection fraction of 10-15% and had heart failure as well as chronic obstructive pulmonary disease, emphysema component.  Patient was on no medications because he could not afford them.  Patient was admitted, started on ceftriaxone and Tequin to cover for community acquired pneumonia as his x-ray showed multilobar infiltrates in the upper lobes bilaterally as well as in the large cardiac silhouette, but no effusion.  Examination was not impressive for congestive heart failure.  His lung sounds initially had very poor air movement, but no significant wheezes or crackles.  His heart rate was regular.  His PMI was small and nondisplaced.  His extremities were without edema.  Good pulses.  No bruits.  No JVD.  No hepatosplenomegaly.  He was saturating 90% on 1.5 L by nasal cannula.  His heart rate was 106. Respiratory rate 24.  Blood pressure 130s/80s-90s.  His EKG showed left ventricular hypertrophy, but no ischemia.  Patient was admitted, started on the antibiotics with continuous pulse oximetry, O2 by nasal cannula, albuterol nebulizers, Solu-Medrol, IV fluids for gentle rehydration, and sputum for culture and AFB.  As patients EKG during the admission showed an axis deviation from his previous EKG at  [facility] we went ahead and got venous Dopplers to rule out possible PE.  Patients lower extremity venous Dopplers were negative.  Patients blood pressures remained slightly elevated during admission.  He was started on Altace 2.5 mg p.o. b.i.d. which he tolerated very well.  Patient did very well in the hospital overnight.  Felt much better very quickly and was discharged home on the second hospital day with the above medical regimen.  He is going to be following up with me, Dr. Susann Givens, in the outpatient clinic for further evaluation and work-up of his CHF.  He did have a 2-D echo while here in the hospital;  however, those results are pending at the time of discharge. DD:  05/07/00 TD:  05/09/00 Job: [id number]

## 2010-11-02 NOTE — Assessment & Plan Note (Signed)
[facility]                           ELECTROPHYSIOLOGY OFFICE NOTE   NAME:CASTLEBenett, Swoyer                        MRN:          [id number]  DATE:02/07/2006                            DOB:          08/07/1940    Mr. Gange returns today for followup.  He is a very pleasant, middle-age  male with a nonischemic cardiomyopathy and congestive heart failure, class  II, who I saw about 1 year ago for consideration for prophylactic ICD  implantation.  The patient's EF has been between 5-10% and despite this, he  continues to do relatively well with mild to moderate heart failure  symptoms.  The patient denies peripheral edema.  He is referred back today  for additional evaluation.  The patient states that his biggest concern  about having an ICD implanted is the cost.   PHYSICAL EXAMINATION:  GENERAL:  He is a pleasant, middle-age man in no  acute distress.  VITAL SIGNS:  Blood pressure 118/70, pulse 72 and regular, respirations 18.  Weight 155 pounds.  NECK:  No jugular venous distention.  LUNGS:  Clear to auscultation bilaterally.  CARDIAC:  Regular rate and rhythm with normal S1, S2.  There is a soft S4  gallop present.  PMI was enlarged and laterally displaced.  ABDOMEN:  Soft,  nontender, nondistended.  No organomegaly.  Bowel sounds present.  EXTREMITIES:  No clubbing, cyanosis or edema.  Pulses were 2+ and symmetric.   IMPRESSION:  1. Nonischemic cardiomyopathy.  2. Congestive heart failure, class II.  3. History of mitral regurgitation.   PLAN:  I have discussed the treatment options with Mr. Welker and I have  recommended proceeding with ICD implantation.  He will consider his options  and call us if he would like to proceed with this.                                   Doylene Canning. Ladona Ridgel, MD   GWT/MedQ  DD:  02/07/2006  DT:  02/07/2006  Job #:  [id number]   cc:   Kari Baars, MD

## 2010-11-02 NOTE — H&P (Signed)
NAMEDEMONTEZ, NOVACK NO.:  [id number]   MEDICAL RECORD NO.:  [id number]          PATIENT TYPE:  EMS   LOCATION:  [facility]                         FACILITY:  [facility]   PHYSICIAN:  Larina Earthly, M.D.        DATE OF BIRTH:  September 22, 1940   DATE OF ADMISSION:  10/19/2006  DATE OF DISCHARGE:                              HISTORY & PHYSICAL   CHIEF COMPLAINT:  Sudden shortness of breath but without chest pain.   HISTORY OF PRESENT ILLNESS:  This is a 70 year old African American male  with a history of nonischemic cardiomyopathy with an ejection fraction  documented to be 5-10%, COPD with ongoing tobacco abuse, mild mitral  regurgitation, hyperlipidemia who presents via EMS with progressive  orthopnea, dyspnea that was noted the severe and initiated at  approximately 10:00 p.m. on 10/18/2006. He denied any chest pain,  nausea, vomiting but did admit to significant diaphoresis.  He did come  to the emergency room via EMS and en route he received nebulizers, C-  PAP, Lasix, nitroglycerin, oxygen and intravenous steroids.  This  resulted in significant diuresis and clinical improvement with a  significant urine output.  Initial chest x-ray was significant with  questionable atelectasis and a pulmonary edema.  The patient reports  that this course of events is unusual for him despite his severe  cardiomyopathy and the last time he had a similar event was  approximately 7 years ago without the significant diaphoresis.  In the  emergency room the patient was now clinically stable with initial set of  cardiac enzymes negative but initial BNP being quite elevated at 1800.  He is now being admitted for further evaluation and management to  include cardiology consultation.   REVIEW OF SYSTEMS:  Is negative for fevers, positive for diaphoresis,  negative for signs or symptoms of upper respiratory tract infection,  positive for seasonal allergic rhinitis, positive for ongoing tobacco  abuse of approximately one pack per day for numerous decades, negative  for chest pain, negative for nausea or vomiting, change in bowel habits,  new neurological or musculoskeletal deficits, change in urine output.  The patient does admit to significant seasonings use with his meals  including that of porkchops the previous night.   PROBLEM LIST:  1. Nonischemic cardiomyopathy with an ejection fraction of  10%,      followed by Dr. Myrtis Ser and with consideration of a defibrillator by      Dr. Ladona Ridgel.  2. COPD with history of longstanding tobacco abuse.  3. A history of mitral regurgitation, mild  4. Hyperlipidemia by analysis of his of medication regimen.  5. History of hernia repair.  6. History of multiple drug addictions in the past but has been clean      for approximately 18-20 years.   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  Still smokes one pack per day  for numerous decades and does not drink alcohol.   FAMILY HISTORY:  Significant for coronary artery disease at an early  age.   MEDICATIONS:  1. Lisinopril 20 mg a day  2. Furosemide 40 mg twice a day  3. Vytorin 10/40 once a day  4. Fexofenadine  180 mg once a day  5. Carvedilol 25 mg b.i.d.  6. Nitroglycerin as needed.  7. The patient states that he was told at some point to stop his      aspirin because his blood was too thin.   ALLERGIES:  NO KNOWN DRUG ALLERGIES.   LABORATORY EVALUATION:  Here in the emergency room.  EKG reveals left  anterior fascicular block and left ventricular hypertrophy, old EKGs are  pending.  Initial set of enzymes myoglobin 66.4, CK-MB 1.8, troponin I  less than 0.05.  BNP 1800.  White blood cell count 5.9, hemoglobin 18.3,  hematocrit 55.9%, platelet count 181. Second set of cardiac enzymes  unremarkable as above.  CHEST X-RAY:  Reveals cardiomegaly with pulmonary venous hypertension,  borderline interstitial edema  and poor definition of left hemidiaphragm  suggestive of left lower lobe atelectasis and/or pneumonia.  Sodium 137,  potassium 4.5, BUN 21, creatinine 1.0, glucose 160 nonfasting.   PHYSICAL EXAM:  We have a pleasant African American male sitting in bed  in no apparent distress, answering all questions appropriately, alert  and oriented x3, in no respiratory distress.  Blood pressure 140/94,  heart rate 72, respirations 22, oxygen saturation 94% on oxygen.  The  patient is afebrile.  HEENT:  Sclerae anicteric.  Extraocular movements are intact.  There is  no oropharyngeal lesions.  Tongue is midline.  Face is symmetrical.  NECK:  Supple.  There is no cervical lymphadenopathy.  There is no  carotid bruits appreciated.  LUNGS:  Clear to auscultation bilaterally now after diuresis.  CARDIOVASCULAR:  Regular rate and rhythm without murmurs, rubs or  gallops appreciated.  ABDOMEN:  Soft, nontender, nondistended abdomen.  Bowel sounds present.  EXTREMITIES:  Reveals no edema.  Pedal pulses are slightly diminished  but intact.  NEUROLOGICAL:  Exam is grossly nonfocal.   ASSESSMENT/PLAN:  1. CHF exacerbation with severe cardiomyopathy at baseline, clinically      compensated after diuresis and has apparently been doing clinically      well for several years despite his severe cardiomyopathy.  Initial      set of enzymes negative.  His BNP was elevated. We are awaiting old      EKGs for comparison of his left anterior fascicular block and LVH      changes.  Will plan cardiac up consultation with [facility] cardiology      for possible echo and further evaluation.  Will continue diuresis      and rule the patient out myocardial infarction. It is again unclear      why the patient is not on aspirin or anticoagulation.  Will defer      this to the cardiology consultant.  2. Tobacco abuse and COPD.  I have discussed with the patient and will     need tobacco cessation counseling.  Currently there is  no evidence      of bronchospasm.  The patient does not use nebulizers at home.  3. Seasonal allergic rhinitis.  Continue Allegra.  4. Will provide DVT prophylaxis.      Larina Earthly, M.D.  Electronically Signed    RA/MEDQ  D:  10/19/2006  T:  10/19/2006  Job:  [id number]   cc:   Luis Abed, MD, [facility]  Doylene Canning. Ladona Ridgel, MD  Robert Bellow  Liliane Shi.D.

## 2010-11-02 NOTE — Discharge Summary (Signed)
NAMEOKIE, BOGACZ NO.:  [id number]   MEDICAL RECORD NO.:  [id number]          PATIENT TYPE:  INP   LOCATION:  3729                         FACILITY:  [facility]   PHYSICIAN:  Kari Baars, M.D.  DATE OF BIRTH:  [birth year]   DATE OF ADMISSION:  10/19/2006  DATE OF DISCHARGE:  10/21/2006                               DISCHARGE SUMMARY   DISCHARGE DIAGNOSES:  1. Congestive heart failure exacerbation.  2. Nonischemic cardiomyopathy (ejection fraction 10%).  3. Nonsustained ventricular tachycardia, recurrent, considering      automatic implantable cardioverter/defibrillator.  He has declined      this in the past.  4. Chronic obstructive pulmonary disease with longstanding tobacco      abuse.  5. History of mild mitral regurgitation.  6. Hyperlipidemia.  7. History of polysubstance abuse with none for the past 18-20 years.  8. History of hernia repair.  9. Impaired glucose tolerance (A1c 5.8).  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   HISTORY OF PRESENT ILLNESS:  For full details, please see dictated  history and physical by Dr. Virgel Manifold.  Briefly, [name] is a 70 year old,  African American male with a history of nonischemic cardiomyopathy  (ejection fraction 5-10%), severe COPD with ongoing tobacco use and mild  mitral regurgitation who presented to the emergency department on May 4,  with increasing shortness of breath, orthopnea, dyspnea on exertion that  began acutely on Oct 18, 2006.  En route to the emergency department, he  did receive treatment with nebulizers, CPAP, Lasix, nitroglycerin,  oxygen and IV steroids.  In the emergency department, evaluation  revealed a chest x-ray with pulmonary edema, markedly elevated BNP of  1800.  Cardiac enzymes were negative with a troponin less than 0.05.  Given his overlapping COPD and heart failure, we were asked to admit the  patient.   HOSPITAL COURSE:  The patient was admitted to a  telemetry bed.  Our  cardiology was consulted and concurred with medical management including  IV Lasix, beta-blocker and ACE inhibitor.  Strict in's and out's were  monitored.  With medical therapy, his symptoms rapidly improved and were  approaching baseline prior to discharge.  He was weaned off of oxygen.  His BNP was 384 prior to discharge.  His renal function remained stable  despite this.  Of note, the patient was found to have multiple episodes  of nonsustained ventricular tachycardia.  I discussed this with him in  detail and discussed the risk of sudden cardiac death as well as the  indication for the AICD.  This been discussed with him multiple times in  the past by Dr. Myrtis Ser and Dr. Ladona Ridgel.  They discussed it with him again  prior to discharge and once again, he stated that he wanted to think  about it.  He was aware of risk of sudden cardiac death, but does seem  to be contemplating the idea.  It appears that his major concern is of  financial concerns.   The patient was again counseled to discontinue smoking and declined.  Of  note, he was noted  to have an elevated blood sugar on admission.  His  A1c was 5.8 which will need outpatient followup.   DISCHARGE MEDICATIONS:  1. Fosinopril 20 mg daily.  2. Lasix 40 mg b.i.d.  3. Vytorin 10/40 daily.  4. Allegra 180 mg daily.  5. Coreg 25 mg b.i.d.  6. Nitroglycerin 0.4 mg p.r.n.  7. Aspirin 81 mg daily.  8. Chantix as directed to stop smoking.   DISCHARGE LABORATORY DATA AND X-RAY FINDINGS:  CBC on the day of  discharge shows a white count of 7.2, hemoglobin 17.5, platelets 171.  BMET significant for sodium of 139, potassium 4.1, chloride 98, bicarb  30, BUN 20, creatinine 1.2, glucose 86.  BNP 384.  A1c 5.8.   SPECIAL INSTRUCTIONS:  He was instructed to weigh himself daily and to  call if his weight increases more than 3-5 pounds.  He should limit his  salt intake.  He was instructed to stop smoking.   FOLLOW UP:  He  will follow up with Dr. Clelia Croft in 2 weeks.  Follow up with  Dr. Myrtis Ser and Dr. Ladona Ridgel as directed.   DISPOSITION:  To home.      Kari Baars, M.D.  Electronically Signed     WS/MEDQ  D:  01/01/2007  T:  01/02/2007  Job:  [id number]   cc:   Doylene Canning. Ladona Ridgel, MD  Luis Abed, MD, [facility]

## 2010-11-02 NOTE — H&P (Signed)
[facility]  Patient:    [name], [name]                        MRN: [id number] Adm. Date:  [id number] Attending:  Cherylynn Ridges CC:         [name], M.D.   History and Physical  IDENTIFICATION AND CHIEF COMPLAINT:  The patient is a 70 year old gentleman with a large right inguinal hernia.  HISTORY OF PRESENT ILLNESS:  I saw [name] on the 18th of December, at which time he was identified as having a large right inguinal hernia.  He had been seen in the emergency department several days prior to coming in, at which time he had what was thought to be an incarcerated hernia and reduced. He was sent to [place] for followup.  On my examination, he had a large right inguinal hernia; however, the left side did not appear to have a hernia.  He has no bowel or urinary difficulty.  PAST MEDICAL HISTORY:  Past medical history is significant for community-acquired pneumonia, congestive heart failure and coronary artery disease; he also has chronic obstructive pulmonary disease.  MEDICATIONS:  His last medications in March of 2001 were: 1. Altace 2.5 mg p.o. b.i.d. 2. Tequin, which had been given only for seven days. 3. He was on a prednisone taper for his COPD. 4. Albuterol two puffs.  When I saw him in the office, he was only taking: 1. Nitro-Bid tabs p.r.n. 2. Altace 2.5 mg b.i.d. 3. Spironolactone 25 mg q.d. 4. Ventolin two puffs q.4h. as needed.  ALLERGIES:  He had no known drug allergies.  PAST SURGICAL HISTORY:  Only previous operation was for tonsils, by his report.  SOCIAL HISTORY:  He is a smoker and continues to smoke at least four to six cigarettes a day.  FAMILY HISTORY:  He had family history of heart disease.  PHYSICAL EXAMINATION  ABDOMEN:  He has a large inguinal hernia which does extend down into the scrotum but not obvious bowel sounds in the scrotum.  He has no peritoneal findings.  It is reducible.  No obvious hernia on  the left side.  LUNGS:  Clear.  CARDIAC:  Regular rhythm and rate.  Possible murmur at the left lower sternal border.  No carotid bruits.  LABORATORY STUDIES:  He had a CBC done on January 8th that showed a white count of 6, hemoglobin of 17 and hematocrit of 52.  BMET at that time:  BUN of 17, creatinine of 1.1.  IMPRESSION:  Large right inguinal hernia.  PLAN:  He should go to surgery for elective hernia repair.  This will be done as an outpatient, unless he has any difficulty, in which case we may keep him overnight for monitoring. DD:  06/26/00 TD:  06/26/00 Job: [id number]

## 2010-11-02 NOTE — Discharge Summary (Signed)
NAMERANDOM, DOBROWSKI NO.:  [id number]   MEDICAL RECORD NO.:  [id number]                   PATIENT TYPE:  INP   LOCATION:  3709                                 FACILITY:  [facility]   PHYSICIAN:  Doylene Canning. Ladona Ridgel, M.D.               DATE OF BIRTH:  1940-11-29   DATE OF ADMISSION:  05/07/2003  DATE OF DISCHARGE:  05/08/2003                                 DISCHARGE SUMMARY   PROCEDURES:  None.   HOSPITAL COURSE:  Mr. Wenke is a 70 year old male with no known history of  coronary artery disease, but who has an EF of less than 20% by  echocardiogram in November 2001.  There were no wall motion abnormalities  and his LV was dilated.  He was seen by the teaching service then and is now  seen for primary care at [facility] and follows up with Dr. Myrtis Ser for  cardiology.  He came to the hospital on May 07, 2003, for neck and back  pain that was sharp and worsened cough, but not with exertion.  He also had  some tingling in his left shoulder that corresponded with his neck pain.  He  was admitted for further evaluation and treatment.   The next day his symptoms had resolved and he had no elevation in his  cardiac enzymes.  Dr. Dietrich Pates evaluated the patient and felt that he had  atypical symptoms, question radiculopathy, and needed C-spine films for  evaluation.  He felt that a Cardiolite was also needed, but these could be  done as an outpatient.  Mr. Arlotta is scheduled to return to the hospital on  May 09, 2003, for a stress test and x-rays.  He is otherwise considered  stable for discharge on May 08, 2003.   LABORATORY DATA:  Hemoglobin 17.9, hematocrit 53.6, WBCs 5, platelets 199.  Sodium 140, potassium 4.1, chloride 106, CO2 not done, BUN 13, creatinine  1.1, glucose 84.  Serial CK-MBs and troponin-I negative for MI.   CONDITION ON DISCHARGE:  Improved.   DISCHARGE DIAGNOSES:  1. Neck and shoulder pain, follow up as an outpatient with  cardiology and     primary care.  2. History of left ventricular dysfunction with an ejection fraction of less     than 20% by echocardiogram in 2001.  3. Status post cerebrovascular accident.  4. Diabetes.  5. Hypertension.  6. Hyperlipidemia.  7. Chronic obstructive pulmonary disease.  8. Status post hernia repair.  9. Family history of coronary artery disease.   ACTIVITY:  As tolerated.   DIET:  He is to stick to a low salt, diabetic diet.  He is to have no  caffeine or no decaf products until after the stress test.  He is not to eat  or drink after midnight tonight, and he can take meds with sips of water  except Glucophage tomorrow morning.   He  is to be at [facility] at 8:30 for stress test and x-rays.   FOLLOWUP:  He is to follow up with [facility] and call for an appointment.  He needs to follow up with Dr. Myrtis Ser, and our office will call.   DISCHARGE MEDICATIONS:  1. Lisinopril 5 mg q. day.  2. Glucophage 500 mg q. day.  3. Zocor 20 mg q. day.  4. Toprol XL 50 mg q. day.  5. Lasix 40 mg b.i.d.  6. Aspirin 325 mg q. day.      Lavella Hammock, P.A. [facility]                  Doylene Canning. Ladona Ridgel, M.D.    RG/MEDQ  D:  05/08/2003  T:  05/09/2003  Job:  [id number]   cc:   Willa Rough, M.D.   [facility]

## 2010-11-02 NOTE — Consult Note (Signed)
NAMEOLUWADEMILADE, MCKIVER NO.:  [id number]   MEDICAL RECORD NO.:  [id number]          PATIENT TYPE:  INP   LOCATION:  1826                         FACILITY:  [facility]   PHYSICIAN:  Doylene Canning. Ladona Ridgel, MD    DATE OF BIRTH:  1941-05-09   DATE OF CONSULTATION:  DATE OF DISCHARGE:                                 CONSULTATION   INDICATION FOR CONSULTATION:  Evaluation and help with treatment of  congestive heart failure.   HISTORY OF PRESENT ILLNESS:  The patient is a 70 year old African  American male with a history of longstanding congestive heart failure  secondary to a dilated cardiomyopathy.  He is normally followed by Dr.  Eric Form.  The patient was in his usual state of health when he  presented to the emergency room with increasing shortness of breath  which occurred approximately 24 hours prior to his initial presentation.  The patient has done well and I have followed him as an outpatient in  the past.  He has severe LV dysfunction with an EF of 10%.  Despite this  his symptoms have been predominately class II with his nonischemic  cardiomyopathy.  The patient has mild mitral regurgitation.  He has  refused ICD implantation in the past.  The patient did note that he came  off of his diet and ate food high in salt in the last 24 hours.  He has  had no chest pain.  In the emergency department his oxygen saturation  was decreased and his blood pressure was 150/95 and he was treated with  BiPAP and IV Lasix with marked improvement.  He is admitted for  additional evaluation.  His additional past medical history is notable  for asthma, history of stroke.  He has got very little deficit.  He has  longstanding hypertension.  He has a history of dyslipidemia.   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  He lives in [place].  He is widowed.  He drinks two alcoholic beverages a day.  He had a half-  pack per day smoking history for 40 years.   MEDICATIONS:   Include fosinopril, Lasix, Vytorin, Coreg 25 twice daily  and nitroglycerin p.r.n.   REVIEW OF SYSTEMS:  Is notable for no fevers, chills or night sweats.  He denies headache, vision or hearing problems. He does admit to some  decreased vision but wears glasses.  He has occasional problems with  rhinorrhea.  He denies any skin rashes or problems.  His cardiac review  of systems is as noted in the HPI.  He has nonproductive cough and  occasional wheezing episodes.  He denies dysuria, hematuria, nocturia.  He does have arthralgias in his knees and ankles.  He denies nausea,  vomiting, diarrhea, constipation, polyuria.  He does have occasional  constipation.  The rest of his review of systems was negative.   PHYSICAL EXAM:  He is a pleasant chronically ill appearing 70 year old  man in no acute distress.  The blood pressure was 140/90, the pulse was  100 and regular, respirations were 18, temperature is 98.  HEENT:  Normocephalic and atraumatic.  Pupils are equal and round.  Oropharynx moist.  Sclerae anicteric.  NECK:  Revealed no jugular venous distension.  There is no thyromegaly.  Trachea was midline.  Carotids were 2+ and symmetric.  LUNGS:  Revealed rales approximately one-third of the way up  bilaterally.  CARDIOVASCULAR:  Exam regular rate and rhythm with normal  S1-S2.  There is a prominent S3 gallop.  The PMI was enlarged.  There is  an LV heave.  ABDOMEN:  Obese, nontender, nondistended.  No organomegaly.  Bowel  sounds are present.  No rebound or guarding.  EXTREMITIES:  Demonstrate no cyanosis, clubbing or edema.  The pulses  were 2+ and symmetric.  NEUROLOGIC:  Alert and oriented x3, cranial nerves intact.  Strength is  5/5 and symmetric.  EKG demonstrates sinus rhythm with LVH.  The QRS duration was  approximately 120 milliseconds.   IMPRESSION:  1. Acute on chronic congestive heart failure (with systolic      dysfunction, EF 91%).  2. Longstanding hypertension.  3.  Dilated cardiomyopathy.  4. Dyslipidemia.  5. Dietary noncompliance.   DISCUSSION:  I would recommend IV Lasix as you are doing. Would continue  his present medications.  If his blood pressure were too low that we may  have to back off on his Coreg.  Sodium restriction and fluid restriction  will be important as will accurate daily weights.  We will plan to  follow the patient with you.      Doylene Canning. Ladona Ridgel, MD  Electronically Signed     GWT/MEDQ  D:  10/19/2006  T:  10/19/2006  Job:  [id number]   cc:   Kari Baars, M.D.

## 2010-11-02 NOTE — Assessment & Plan Note (Signed)
[facility]                         GASTROENTEROLOGY OFFICE NOTE   NAME:[name], [name]                        MRN:          [id number]  DATE:07/22/2006                            DOB:          03-Nov-1940    REFERRING PHYSICIAN:  Kari Baars, M.D.   REASON FOR CONSULTATION:  Colonoscopy.   HISTORY:  This is a 70 year old African-American male with a history of  nonischemic cardiomyopathy with congestive heart failure, mitral  regurgitation, prior history of drug and tobacco abuse, asthma, stroke,  and kidney stones. He is referred through the courtesy of Dr. Clelia Croft  regarding colonoscopy. The patient has a history of colon cancer in both  is brother and sister. His GI review of systems is remarkable only for  minor intermittent rectal bleeding on the tissue. No associated rectal  pain. No blood in the stool or toilet bowl. He denies nausea, vomiting,  abdominal pain, or change in his bowel habits. He has not had prior  screening. From a cardiac standpoint, he has been stable.   PAST MEDICAL HISTORY:  As above.   PAST SURGICAL HISTORY:  Bilateral inguinal hernia repair.   ALLERGIES:  No known drug allergies.   CURRENT MEDICATIONS:  1. Vytorin 10/40 mg daily.  2. Fexofenadine 180 mg daily.  3. Carvedilol 1 b.i.d.  4. Furosemide 40 mg daily.  5. Fluticasone 50 mcg at night.  6. Loratadine 10 mg daily.  7. Saline nasal spray.  8. __________  10 mg daily.   FAMILY HISTORY:  Brother and sister with colon cancer.   SOCIAL HISTORY:  The patient is widowed with 2 children and lives alone.  Smokes a half pack of cigarettes per day, drinks 1-2 beers per day.   REVIEW OF SYSTEMS:  Per diagnostic evaluation form.   PHYSICAL EXAMINATION:  GENERAL:  Pleasant, well-appearing male in no  acute distress.  VITAL SIGNS:  Blood pressure 122/74, heart rate is 80, weight is 159.2  pounds. He is 5 feet 11 inches in height.  HEENT:  Sclera are anicteric.  Conjunctiva are pink. Oral mucosa is  intact. No adenopathy.  LUNGS:  Clear.  HEART:  Regular without significant murmur.  ABDOMEN:  Soft, nontender, nondistended with good bowel sounds. No  organomegaly, masses or hernia.  EXTREMITIES:  Without edema.   IMPRESSION:  1. Strong family history of colon cancer in two first-degree      relatives.  2. Minor intermittent rectal bleeding likely due to benign anorectal      pathology.  3. Multiple medical problems including a history of heart failure and      stroke.   RECOMMENDATIONS:  Colonoscopy with polypectomy if necessary. The nature  of the procedure as well as the risks, benefits, and alternatives were  discussed in detail with the patient. He understood and agreed to  proceed.     [name]. Paul Goodell, MD  Electronically Signed    JNP/MedQ  DD: 07/22/2006  DT: 07/22/2006  Job #: [id number]   cc:   Kari Baars, M.D.

## 2010-11-02 NOTE — Assessment & Plan Note (Signed)
[facility]                            CARDIOLOGY OFFICE NOTE   NAME:CASTLERusty, Villella                        MRN:          [id number]  DATE:07/28/2006                            DOB:          February 22, 1941    Mr. Kampf is doing well. He has spoken with Dr. Ladona Ridgel about a possible  ICD implantation. The patient is hesitant and I have made it clear to  him all along that we want to provide the information the best we can  and he will make his decisions. He is not having any chest pain or  shortness of breath. He has not had syncope or presyncope.   PAST MEDICAL HISTORY:   ALLERGIES:  No known drug allergies.   MEDICATIONS:  Lisinopril, Vytorin, fexofenadine, carvedilol 25 b.i.d.,  loratadine and furosemide 40 b.i.d. He is also on fluticasone.   OTHER MEDICAL PROBLEMS:  See my note of January 15, 2006.   REVIEW OF SYSTEMS:  He is feeling well. He tells me that he is to have a  colonoscopy and he should be stable for this. Otherwise his review of  systems is negative.   PHYSICAL EXAMINATION:  VITAL SIGNS:  Weight is 163. Blood pressure  128/80 with a pulse of 95.  GENERAL:  The patient is oriented to person, time and place. Affect is  normal.  HEENT:  He has no xanthelasma. There is normal extraocular motion.  NECK:  There are no carotid bruits. There is no jugular venous  distention.  LUNGS:  Clear. Respiratory effort is not labored.  CARDIAC:  Reveals an S1 with an S2. There are no clicks or significant  murmurs.  ABDOMEN:  Soft. He has no significant peripheral edema.   PROBLEM LIST:  Problems are listed numbers 1-7 on my note of January 15, 2006.   1. Cardiomyopathy. He is on the appropriate medications. His rate is      higher than I would like to see today but he has not yet taken his      carvedilol and we talked about this. He is on the appropriate      medicines. He is considering ICD placement over time. I told him      that he is stable  to have his colonoscopy. It will be important for      him not to have excess salt and fluid volume with his prep.     Luis Abed, MD, [facility]  Electronically Signed   JDK/MedQ  DD: 07/28/2006  DT: 07/28/2006  Job #: [id number]   cc:   Kari Baars, M.D.

## 2010-11-02 NOTE — H&P (Signed)
NAMECORNELIS, [name] NO.:  [id number]   MEDICAL RECORD NO.:  [id number]                   PATIENT TYPE:  EMS   LOCATION:  MINO                                 FACILITY:  [facility]   PHYSICIAN:  Creta Levin, M.D. [facility]      DATE OF BIRTH:  1940/09/29   DATE OF ADMISSION:  05/07/2003  DATE OF DISCHARGE:                                HISTORY & PHYSICAL   PRIMARY CARE [name]:  [facility].   CHIEF COMPLAINT:  Neck, back, and shoulder pain.   HISTORY OF PRESENT ILLNESS:  [name] is a 70 year old male with a history  of congestive heart failure who complains of two months of neck and back  pain that is sharp in intensity and worsened with cough but not worsened  with exertion.  He also complains of some tingling in his left shoulder that  corresponds with his neck pain.  He has had no dyspnea, diaphoresis, nausea,  vomiting, orthopnea, PND, or edema.  He states that he can walk greater than  or equal to a mile without any limitation.  He also says that he can go up  stairs without difficulty.   PAST MEDICAL HISTORY:  1. Congestive heart failure.  Ejection fraction is reportedly 10-15% per     echo in [place], [place] a number of years ago.  The patient     has not had cardiac catheterization, by his recollection.  2. Stroke:  Patient states that he has some problems with left eye     peripheral vision.  3. Diabetes.  4. Hypertension.  5. Hyperlipidemia.  6. COPD.  7. Inguinal hernia repair.   ALLERGIES:  No known drug allergies.   MEDICATIONS:  1. Lisinopril 5 mg q.d.  2. Glucophage 500 mg q.d.  3. Zocor 20 mg q.d.  4. Toprol XL 50 mg q.d.  5. Aspirin 325 mg q.d.  6. Lasix 40 mg b.i.d.   SOCIAL HISTORY:  The patient lives in [place] by himself.  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  He is  a 40-pack-year smoker and still smokes one-half pack per day.  He does  not  drink alcohol.   FAMILY HISTORY:  Significant for two brothers with coronary disease  requiring cardiac surgery in their 41s.   REVIEW OF SYSTEMS:  Positive for some diminished left-sided peripheral  vision and a nonproductive cough.  Otherwise, his ten-point review of  systems is negative except for what is mentioned in the HPI.   PHYSICAL EXAMINATION:  VITAL SIGNS:  Temperature 98.7, blood pressure  137/82, pulse 78, SaO2 99% on room air.  GENERAL:  He is in no acute distress.  HEENT:  Unremarkable.  NECK:  No carotid bruits or jugular venous distention but there was  hepatojugular reflux.  He did have tenderness of the cervical spine around  the C6-7 region.  He also had some reproduction of his neck and shoulder  pain with axial compression.  LUNGS:  Clear.  I did not appreciate any dullness or egophony.  CARDIOVASCULAR:  Regular rate and rhythm without gallop, murmur or rub.  ABDOMINAL:  Benign.  EXTREMITIES:  No edema or cyanosis.  NEUROLOGIC:  Nonfocal with the exception of the diminished left-sided  temporal vision.   Chest x-ray revealed cardiomegaly but no edema.   His ECG revealed a normal sinus rhythm with probable LVH and ST/T wave  changes secondary to the LVH.   LABS:  White count was 5, hemoglobin 17.9, hematocrit 53.6, platelets 199.  Sodium 140, potassium 4.1, chloride 106, bicarbonate 26, BUN 13, creatinine  1.1, glucose 84.  His MB was 1.5.  Troponin I was less than 0.05.  He had a  PTT of 30 and a PT/INR of 1.   ASSESSMENT/PLAN:  1. Neck and shoulder pain:  I feel this is most likely due to a cervical     spine process such as degenerative joint or disk disease.  I am doubtful     that this pain is reflective of coronary ischemia.  For this, I will     treat his pain with Tylenol and/or Percocet.  I do not feel he needs     heparin, given this pain is likely musculoskeletal in origin.  2. Congestive heart failure:  By report, the patient had an echo  showing an     ejection fraction of 10-15%.  We do not have an echo here to corroborate;     therefore, I will order a chest wall echo to see what his LV function is.     He is New York Heart Association Class I, so I would be surprised if his     EF was truly in the 10-15% range.  At this point, I will continue all of     his cardiac medications.  We did discuss smoking at great length.  3. Diabetes:  The patient actually did not know that he had diabetes but     comes in with Glucophage in his medication bag.  I am holding this until     we see what his LVEF is.  4. Thrombocytosis:  The patient's hemoglobin is 17.9.  I wonder if this is     all due to smoking and his COPD, but it may be worth looking into,     especially given that he has at least one stroke, by his report.                                                Creta Levin, M.D. [facility]    RPK/MEDQ  D:  05/07/2003  T:  05/08/2003  Job:  [id number]   cc:   [facility]

## 2010-11-02 NOTE — Op Note (Signed)
NAMELAVONE, WEISEL NO.:  [id number]   MEDICAL RECORD NO.:  [id number]                   PATIENT TYPE:  AMB   LOCATION:  DSC                                  FACILITY:  [facility]   PHYSICIAN:  Jimmye Norman III, M.D.               DATE OF BIRTH:  10-12-1940   DATE OF PROCEDURE:  02/02/2004  DATE OF DISCHARGE:                                 OPERATIVE REPORT   PREOPERATIVE DIAGNOSIS:  Large left inguinal hernia.   POSTOPERATIVE DIAGNOSIS:  Large sliding indirect inguinal hernia with  smaller direct inguinal hernia.   PROCEDURE:  Left inguinal hernia repair with mesh.   SURGEON:  Jimmye Norman, M.D.   ASSISTANT:  None.   ANESTHESIA:  General with a laryngeal airway.   ESTIMATED BLOOD LOSS:  Less than 30 mL.   COMPLICATIONS:  None.   CONDITION:  Stable.   FINDINGS:  The patient had a large sliding left inguinal hernia which  involved the sigmoid colon and a smaller direct component.   INDICATIONS FOR PROCEDURE:  The patient is a 70 year old male with a  previous right inguinal hernia repair who comes in now with a large left  inguinal hernia.   OPERATION:  The patient was taken to the operating room and placed on the  table in supine position.  After an adequate general laryngeal airway  anesthetic was administered, he was prepped and draped in the usual sterile  manner exposing his left groin.  A transverse curvilinear incision was made  using a 10 blade and taken down to the external oblique fascia.  This was  done mostly with electrocautery.  We used layered retractors in place as we  dissected out the overlying areolar tissue.  We split the external oblique  fascia along its fibers through the superficial ring exposing the spermatic  cord and the large hernia.  We were able to dissect out the hernia and the  spermatic cord encircling it with a Penrose drain and subsequently  dissecting that from the surrounding structures.  We placed the  spermatic  cord with the hernia and dissected the large indirect sac away from the  spermatic cord protecting the vas deferens.  We opened the indirect sac  after we had dissected it away and saw about a 6-8 inch  loop of sigmoid  colon was attached into the sac.  No damage to the sigmoid colon was made.  We were able to detach the sigmoid colon from the lining of the sac by  dissecting away appendices epiploica that kept it tethered into the sac.  That allowed it to reduce into the peritoneal cavity.  Once this was done,  we twisted the sac at its neck and then placed two suture ligatures of 0  Ethibond suture at the neck of the sac.  We transected the sac in order to  retract underneath the internal oblique and  transverse abdominis muscles.  There was a direct component, also, and a lipoma of the cord.  The direct  component we just imbricated on itself using 0 Ethibond sutures and then we  placed an oval piece of mesh measuring 4 by 2 cm in size attaching it to the  pubic tubercle and inferolaterally to the reflected portion of the inguinal  ligament and anteromedially to the conjoined tendon.  This was done using a  running 0 Prolene suture.  We tied each side separately and did not cross  the midline with the two sutures and was irrigated with antibiotic solution.  The mesh had been soaked in antibiotic solution prior to implantation.  Once  the mesh was in place, we closed the external oblique fascia on top of the  spermatic cord and the mesh using a running 3-0 Vicryl.  Once this was done,  the Scarpa's fascia was reapproximated using three interrupted sutures of 3-  0 Vicryl and then the skin was closed using a running subcuticular stitch of  4-0 Prolene.  A sterile dressing was applied.  Prior to closure, we did  inject 20 mL of 0.5% Marcaine without epinephrine into the wound and in a  field block.                                               Kathrin Ruddy, M.D.     JW/MEDQ  D:  02/02/2004  T:  02/02/2004  Job:  [id number]

## 2010-11-02 NOTE — Op Note (Signed)
[facility]  Patient:    [name], [name]                        MRN: [id number] Proc. Date: 06/26/00 Adm. Date:  [id number] Disc. Date: [id number] Attending:  Cherylynn Ridges                           Operative Report  PREOPERATIVE DIAGNOSIS:  Massive right inguinal hernia.  POSTOPERATIVE DIAGNOSIS:  Massive indirect right inguinal hernia.  SURGEON:  Jimmye Norman, M.D.  PROCEDURE:  Repair of right inguinal hernia using Marlex mesh.  SURGEON:  Jimmye Norman, M.D.  ASSISTANT:  None.  ANESTHESIA:  General with a laryngeal airway.  ESTIMATED BLOOD LOSS:  Less than 75-100 cc.  COMPLICATIONS:  None.  CONDITION:  Stable.  INDICATION FOR OPERATION:  The patient is a 70 year old male with a large right inguinal hernia who comes in now for repair.  FINDINGS:  The patient had a sliding right hernia with the cecum attached to the wall of the hernia sac.  The appendix and the right colon were all noted in the sac but neither were violated.  DESCRIPTION OF PROCEDURE:  The patient was taken to the operating room and placed on the table in a supine position.  After an adequate general laryngeal anesthetic was administered, he was prepped and draped in the usual sterile manner exposing the right groin area.  The #10 blade was used to make a 5-6 cm transverse curvilinear incision at the level of the superficial ring.  This was taken down to and through the subcutaneous tissue with a small venous branch being ligated with 3-0 Vicryl. Once we were down to the aponeurosis of the external oblique, this fascia was split along its fibers through the superficial ring.  It was during this opening of this fascia that the ilioinguinal nerve was transected and subsequently cut back close to the base of the large hernia sac.  We opened the external oblique fascia down at the pubic tubercle, and at that point, we were able to encircle this massive hernia sac along with  the contents of the spermatic cord.  Once we did so, we were able to place it up on a work bench; however, it was difficult to do so because of the massive size of the hernia.  It did extend down towards into the scrotum, and we had to dissect the sac away from the scrotum in order to freely mobilize it for repair.  We were able to detach the distal extent of the sac from the scrotum and the tunica vaginalis.  Once doing this, we were able to separate it from the spermatic cord, preserving the vas deferens and its vessels.  We could clearly see where the hernia sac came out lateral to the inferior epigastric vessels. Once we freed it from the spermatic cord, we did open the sac and found there to be a large amount of omentum in the sac, and also the cecum was contained and attached to the peritoneal lining.  We had to free this up somewhat in order to get the cecum and its contents back into the peritoneal cavity.  We were able to do so adequately, and subsequently we twisted the sac upon its base, placed two suture ligatures at the base of the sac, and then subsequently transected it.  Care was taken not to impinge upon any colon or bowel  structures during this repair.  Once the sac had been transected and double suture ligated, we did place an oval piece of Marlex mesh measuring approximately 2 x 5 cm in size to the conjoint tendon and the reflective portion of the inguinal ligament inferolaterally.  Along the inferior lateral border of the canal near the pubic tubercle, we attached the mesh to Coopers ligament with the transition stitch being made at the femoral vein.  Once the mesh was sutured in place using 0 Prolene suture, we irrigated it after soaking it in antibiotic solution.  We irrigated again in the antibiotic solution and then subsequently closed the external oblique on top of the spermatic cord which was placed on top of the mesh.  Once this was done, we reapproximated  Scarpas fascia using 3-0 Vicryl, and then the skin was closed using a running subcuticular stitch of 4-0 Vicryl.  Marcaine 0.25% was injected into the subcutaneous space and tissue, and then Steri-Strips applied along with the sterile dressing with Betadine ointment. DD:  06/26/00 TD:  06/27/00 Job: [id number]

## 2011-03-11 ENCOUNTER — Telehealth: Payer: Self-pay | Admitting: Cardiology

## 2011-03-11 NOTE — Telephone Encounter (Signed)
LOV.12 faxed to [facility] @ [phone]   03/11/11/km

## 2011-03-19 LAB — POCT I-STAT, CHEM 8
BUN: 15
Calcium, Ion: 1.01 — ABNORMAL LOW
HCT: 63 — ABNORMAL HIGH
TCO2: 26

## 2011-03-19 LAB — RAPID URINE DRUG SCREEN, HOSP PERFORMED
Benzodiazepines: NOT DETECTED
Cocaine: NOT DETECTED
Tetrahydrocannabinol: NOT DETECTED

## 2011-03-19 LAB — CBC
HCT: 55 — ABNORMAL HIGH
HCT: 58.8 — ABNORMAL HIGH
Hemoglobin: 18.8 — ABNORMAL HIGH
MCHC: 32.7
MCV: 89.4
Platelets: 173
RDW: 15.6 — ABNORMAL HIGH
RDW: 15.7 — ABNORMAL HIGH

## 2011-03-19 LAB — BASIC METABOLIC PANEL
BUN: 11
Calcium: 9
Creatinine, Ser: 0.89
GFR calc non Af Amer: >60
Potassium: 4.1

## 2011-03-19 LAB — CARDIAC PANEL(CRET KIN+CKTOT+MB+TROPI)
CK, MB: 2.7
Relative Index: INVALID
Troponin I: 0.03
Troponin I: 0.04

## 2011-03-19 LAB — URINALYSIS, ROUTINE W REFLEX MICROSCOPIC
Bilirubin Urine: NEGATIVE
Nitrite: NEGATIVE
Specific Gravity, Urine: 1.014
Urobilinogen, UA: 0.2

## 2011-03-19 LAB — DIFFERENTIAL
Basophils Absolute: 0
Eosinophils Relative: 1
Lymphocytes Relative: 37
Neutro Abs: 2.4
Neutrophils Relative %: 46

## 2011-03-19 LAB — COMPREHENSIVE METABOLIC PANEL
BUN: 12
CO2: 29
Chloride: 102
Creatinine, Ser: 0.89
GFR calc non Af Amer: >60
Glucose, Bld: 88
Total Bilirubin: 0.7

## 2011-03-19 LAB — B-NATRIURETIC PEPTIDE (CONVERTED LAB): Pro B Natriuretic peptide (BNP): 80

## 2011-03-19 LAB — URINE MICROSCOPIC-ADD ON

## 2011-03-19 LAB — ETHANOL: Alcohol, Ethyl (B): <5

## 2011-03-19 LAB — TROPONIN I: Troponin I: 0.03

## 2011-04-23 ENCOUNTER — Ambulatory Visit (INDEPENDENT_AMBULATORY_CARE_PROVIDER_SITE_OTHER): Payer: Medicare Other | Admitting: Cardiology

## 2011-04-23 ENCOUNTER — Encounter: Payer: Self-pay | Admitting: Cardiology

## 2011-04-23 DIAGNOSIS — E8770 Fluid overload, unspecified: Secondary | ICD-10-CM

## 2011-04-23 DIAGNOSIS — I428 Other cardiomyopathies: Secondary | ICD-10-CM

## 2011-04-23 DIAGNOSIS — I5022 Chronic systolic (congestive) heart failure: Secondary | ICD-10-CM | POA: Insufficient documentation

## 2011-04-23 DIAGNOSIS — E8779 Other fluid overload: Secondary | ICD-10-CM

## 2011-04-23 DIAGNOSIS — I509 Heart failure, unspecified: Secondary | ICD-10-CM

## 2011-04-23 DIAGNOSIS — Z136 Encounter for screening for cardiovascular disorders: Secondary | ICD-10-CM

## 2011-04-23 LAB — BASIC METABOLIC PANEL
Calcium: 9 mg/dL (ref 8.4–10.5)
Creatinine, Ser: 1.1 mg/dL (ref 0.4–1.5)
GFR: 89.38 mL/min (ref >60.00)
Sodium: 141 mEq/L (ref 135–145)

## 2011-04-23 MED ORDER — SPIRONOLACTONE 12.5 MG HALF TABLET: 12.5000 mg | ORAL_TABLET | Freq: Every day | ORAL | Status: DC

## 2011-04-23 NOTE — Assessment & Plan Note (Signed)
The patient has chronic stable CHF.  He has severe cardiomyopathy.  As outlined in the history of present illness he is on a good dose of carvedilol and ACE inhibitor.  I had a careful discussion with him about the reason to try spironolactone.  He is in agreement.  We will start 12.5 mg daily and watch his potassium very carefully.

## 2011-04-23 NOTE — Patient Instructions (Signed)
Your physician recommends that you schedule a follow-up appointment in: 6 months Your physician recommends that you return for lab work in: today, 1 week, and 3 weeks Your physician has recommended you make the following change in your medication: START Spironolactone 25 mg 1/2 tablet daily

## 2011-04-23 NOTE — Progress Notes (Signed)
HPI The patient is seen for cardiology followup.  I saw him last in May, 2012.  I have followed him for many years with very severe left ventricular dysfunction.  His ejection fraction is in the tendon 15% range.  He has significant dilatation of the ventricle.  Over the years it has been repeatedly clear that he does not want an ICD.  It is amazing that he has not had clinical heart failure.  He is on a good dose of ACE inhibitor and beta blocker.  Up to this point he has not been on spironolactone. No Known Allergies  Current Outpatient Prescriptions  Medication Sig Dispense Refill  . aspirin 81 MG tablet Take 81 mg by mouth daily.        . carvedilol (COREG) 25 MG tablet TAKE ONE TABLET TWICE DAILY  60 tablet  11  . fluticasone (VERAMYST) 27.5 MCG/SPRAY nasal spray 2 sprays by Nasal route daily.        . fosinopril (MONOPRIL) 40 MG tablet TAKE ONE TABLET DAILY  30 tablet  11  . furosemide (LASIX) 40 MG tablet Take 40 mg by mouth 2 (two) times daily.        . rosuvastatin (CRESTOR) 40 MG tablet Take 40 mg by mouth daily.          History   Social History  . Marital Status: Widowed    Spouse Name: N/A    Number of Children: 3  . 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 Smoking status: Former Smoker    Types: Cigarettes    Quit date: 06/17/2010  . Smokeless tobacco: Never Used  . Alcohol Use: 2.0 oz/week    4 drink(s) per week  . Drug Use: Yes     hx of clean for 20+ years  . Sexually Active: Not on file   Other Topics Concern  . Not on file   Social History Narrative  . No narrative on file    Family History  Problem Relation Age of Onset  . Diabetes Mother     Past Medical History  Diagnosis Date  . Pneumonia 11/01  . Cardiomyopathy     refused cath and refused icd/echo.Marland KitchenMarland Kitchen6/2009 10%  LV .Marland Kitchen.39mm+  . Volume overload   . H/O: drug dependency     In the past., clean for greater than 20 years  . Ejection  fraction < 50%     EF 10%, echo, June, 2009, LV 75 mm  . Mitral regurgitation     Mild by history  . Right ventricular dysfunction     Mild to moderate, echo, June, 2009  . Tobacco abuse   . Systolic CHF, chronic     Patient is refusing ICD over the years    No past surgical history on file.  ROS  Patient denies fever, chills, headache, sweats, rash, change in vision, change in hearing, chest pain, cough, nausea vomiting, urinary symptoms.  All other systems are reviewed and are negative.  PHYSICAL EXAM Patient is stable.  Head is atraumatic.  There is no xanthelasma.  There is no jugular venous distention.  Lungs are clear.  Respiratory effort is nonlabored.  Cardiac exam reveals S1-S2.  There are no clicks or significant murmurs.  The abdomen is soft.  There is no peripheral edema.  There are no musculoskeletal deformities.  No skin rashes. Filed Vitals:   04/23/11 1051  BP: 106/78  Pulse: 52  Height: 5\' 11"  (1.803 m)  Weight: 169 lb (76.658 kg)    EKG is done today and reviewed by me.He has old increased voltage with interventricular conduction delay and marked ST-T wave changes.  There is no significant change.  ASSESSMENT & PLAN

## 2011-04-23 NOTE — Assessment & Plan Note (Signed)
Patient has no evidence of significant volume overload at this time.  We will watch him carefully as spironolactone was added.  I will decide if his Lasix dose he cut back or not

## 2011-04-23 NOTE — Assessment & Plan Note (Signed)
As noted the patient has severe cardiomyopathy.  At this point there is no reason to repeat his echo

## 2011-04-30 ENCOUNTER — Other Ambulatory Visit (INDEPENDENT_AMBULATORY_CARE_PROVIDER_SITE_OTHER): Payer: Medicare Other | Admitting: *Deleted

## 2011-04-30 DIAGNOSIS — I509 Heart failure, unspecified: Secondary | ICD-10-CM

## 2011-04-30 DIAGNOSIS — E8770 Fluid overload, unspecified: Secondary | ICD-10-CM

## 2011-04-30 DIAGNOSIS — I5022 Chronic systolic (congestive) heart failure: Secondary | ICD-10-CM

## 2011-04-30 DIAGNOSIS — E8779 Other fluid overload: Secondary | ICD-10-CM

## 2011-04-30 DIAGNOSIS — I428 Other cardiomyopathies: Secondary | ICD-10-CM

## 2011-04-30 LAB — BASIC METABOLIC PANEL
Chloride: 107 mEq/L (ref 96–112)
Creatinine, Ser: 1.3 mg/dL (ref 0.4–1.5)
GFR: 71.76 mL/min (ref >60.00)
Potassium: 4.1 mEq/L (ref 3.5–5.1)

## 2011-05-03 NOTE — Progress Notes (Signed)
Pt states he is feeling ok.  He will stop the lasix and recheck bmet in one week.

## 2011-05-10 ENCOUNTER — Other Ambulatory Visit (INDEPENDENT_AMBULATORY_CARE_PROVIDER_SITE_OTHER): Payer: Medicare Other | Admitting: *Deleted

## 2011-05-10 DIAGNOSIS — E8770 Fluid overload, unspecified: Secondary | ICD-10-CM

## 2011-05-10 DIAGNOSIS — E8779 Other fluid overload: Secondary | ICD-10-CM

## 2011-05-10 DIAGNOSIS — I5022 Chronic systolic (congestive) heart failure: Secondary | ICD-10-CM

## 2011-05-10 DIAGNOSIS — I509 Heart failure, unspecified: Secondary | ICD-10-CM

## 2011-05-10 DIAGNOSIS — I428 Other cardiomyopathies: Secondary | ICD-10-CM

## 2011-05-10 LAB — BASIC METABOLIC PANEL
CO2: 27 mEq/L (ref 19–32)
GFR: 102.8 mL/min (ref >60.00)
Glucose, Bld: 117 mg/dL — ABNORMAL HIGH (ref 70–99)
Potassium: 4.1 mEq/L (ref 3.5–5.1)
Sodium: 140 mEq/L (ref 135–145)

## 2011-05-14 ENCOUNTER — Ambulatory Visit (INDEPENDENT_AMBULATORY_CARE_PROVIDER_SITE_OTHER): Payer: Medicare Other | Admitting: *Deleted

## 2011-05-14 DIAGNOSIS — I428 Other cardiomyopathies: Secondary | ICD-10-CM

## 2011-05-14 LAB — BASIC METABOLIC PANEL
CO2: 27 mEq/L (ref 19–32)
Chloride: 106 mEq/L (ref 96–112)
Creatinine, Ser: 1 mg/dL (ref 0.4–1.5)
Potassium: 4.2 mEq/L (ref 3.5–5.1)

## 2011-07-10 ENCOUNTER — Encounter: Payer: Self-pay | Admitting: Internal Medicine

## 2011-07-18 ENCOUNTER — Encounter: Payer: Self-pay | Admitting: Internal Medicine

## 2011-08-05 ENCOUNTER — Ambulatory Visit (AMBULATORY_SURGERY_CENTER): Payer: Medicare Other

## 2011-08-05 ENCOUNTER — Encounter: Payer: Self-pay | Admitting: Internal Medicine

## 2011-08-05 VITALS — Ht 71.0 in | Wt 165.0 lb

## 2011-08-05 DIAGNOSIS — Z1211 Encounter for screening for malignant neoplasm of colon: Secondary | ICD-10-CM

## 2011-08-05 MED ORDER — PEG-KCL-NACL-NASULF-NA ASC-C 100 G PO SOLR: 1.0000 | Freq: Once | ORAL | Status: DC

## 2011-08-27 ENCOUNTER — Encounter: Payer: Self-pay | Admitting: Internal Medicine

## 2011-08-27 ENCOUNTER — Ambulatory Visit (AMBULATORY_SURGERY_CENTER): Payer: Medicare Other | Admitting: Internal Medicine

## 2011-08-27 VITALS — BP 146/89 | HR 64 | Temp 96.2°F | Resp 16 | Ht 71.0 in | Wt 165.0 lb

## 2011-08-27 DIAGNOSIS — D126 Benign neoplasm of colon, unspecified: Secondary | ICD-10-CM

## 2011-08-27 DIAGNOSIS — Z1211 Encounter for screening for malignant neoplasm of colon: Secondary | ICD-10-CM

## 2011-08-27 DIAGNOSIS — Z860100 Personal history of colon polyps, unspecified: Secondary | ICD-10-CM

## 2011-08-27 MED ORDER — SODIUM CHLORIDE 0.9 % IV SOLN: 500.0000 mL | INTRAVENOUS | Status: DC

## 2011-08-27 NOTE — Progress Notes (Signed)
Patient did not experience any of the following events: a burn prior to discharge; a fall within the facility; wrong site/side/patient/procedure/implant event; or a hospital transfer or hospital admission upon discharge from the facility. (G8907) Patient with preoperative order for IV antibiotic SSI prophylaxis, antibiotic initiated on time. (G8916)  

## 2011-08-27 NOTE — Patient Instructions (Signed)
YOU HAD AN ENDOSCOPIC PROCEDURE TODAY AT THE [facility]: Refer to the procedure report that was given to you for any specific questions about what was found during the examination.  If the procedure report does not answer your questions, please call your gastroenterologist to clarify.  If you requested that your care partner not be given the details of your procedure findings, then the procedure report has been included in a sealed envelope for you to review at your convenience later.  YOU SHOULD EXPECT: Some feelings of bloating in the abdomen. Passage of more gas than usual.  Walking can help get rid of the air that was put into your GI tract during the procedure and reduce the bloating. If you had a lower endoscopy (such as a colonoscopy or flexible sigmoidoscopy) you may notice spotting of blood in your stool or on the toilet paper. If you underwent a bowel prep for your procedure, then you may not have a normal bowel movement for a few days.  DIET: Your first meal following the procedure should be a light meal and then it is ok to progress to your normal diet.  A half-sandwich or bowl of soup is an example of a good first meal.  Heavy or fried foods are harder to digest and may make you feel nauseous or bloated.  Likewise meals heavy in dairy and vegetables can cause extra gas to form and this can also increase the bloating.  Drink plenty of fluids but you should avoid alcoholic beverages for 24 hours.  ACTIVITY: Your care partner should take you home directly after the procedure.  You should plan to take it easy, moving slowly for the rest of the day.  You can resume normal activity the day after the procedure however you should NOT DRIVE or use heavy machinery for 24 hours (because of the sedation medicines used during the test).    SYMPTOMS TO REPORT IMMEDIATELY: A gastroenterologist can be reached at any hour.  During normal business hours, 8:30 AM to 5:00 PM Monday through Friday,  call [phone].  After hours and on weekends, please call the GI answering service at [phone] who will take a message and have the physician on call contact you.   Following lower endoscopy (colonoscopy or flexible sigmoidoscopy):  Excessive amounts of blood in the stool  Significant tenderness or worsening of abdominal pains  Swelling of the abdomen that is new, acute  Fever of 100F or higher  Following upper endoscopy (EGD)  Vomiting of blood or coffee ground material  New chest pain or pain under the shoulder blades  Painful or persistently difficult swallowing  New shortness of breath  Fever of 100F or higher  Black, tarry-looking stools  FOLLOW UP: If any biopsies were taken you will be contacted by phone or by letter within the next 1-3 weeks.  Call your gastroenterologist if you have not heard about the biopsies in 3 weeks.  Our staff will call the home number listed on your records the next business day following your procedure to check on you and address any questions or concerns that you may have at that time regarding the information given to you following your procedure. This is a courtesy call and so if there is no answer at the home number and we have not heard from you through the emergency physician on call, we will assume that you have returned to your regular daily activities without incident.  SIGNATURES/CONFIDENTIALITY: You and/or your care   partner have signed paperwork which will be entered into your electronic medical record.  These signatures attest to the fact that that the information above on your After Visit Summary has been reviewed and is understood.  Full responsibility of the confidentiality of this discharge information lies with you and/or your care-partner.  

## 2011-08-27 NOTE — Op Note (Signed)
[facility] [address] [place], [place]  [address]  COLONOSCOPY PROCEDURE REPORT  PATIENT:  Paul, Potts  MR#:  [id number] BIRTHDATE:  1941-01-18, 70 yrs. old  GENDER:  male ENDOSCOPIST:  Wilhemina Bonito. Eda Keys, MD REF. BY:  Surveillance Program Recall, PROCEDURE DATE:  08/27/2011 PROCEDURE:  Colonoscopy with snare polypectomy x 1 ASA CLASS:  Class II INDICATIONS:  history of pre-cancerous (adenomatous) colon polyps, surveillance and high-risk screening ; index 08-2006 w/ TAs MEDICATIONS:   MAC sedation, administered by CRNA, propofol (Diprivan) 150 mg IV  DESCRIPTION OF PROCEDURE:   After the risks benefits and alternatives of the procedure were thoroughly explained, informed consent was obtained.  Digital rectal exam was performed and revealed no abnormalities.   The LB [id number] endoscope was introduced through the anus and advanced to the cecum, which was identified by both the appendix and ileocecal valve, without limitations.  The quality of the prep was excellent, using MoviPrep.  The instrument was then slowly withdrawn as the colon was fully examined. <<PROCEDUREIMAGES>>  FINDINGS:  A diminutive polyp was found in the rectum. Polyp was snared without cautery. Retrieval was successful. Moderate diverticulosis was found found scattered throught the colon. Retroflexed views in the rectum revealed internal hemorrhoids. The time to cecum =  2:34  minutes. The scope was then withdrawn in 11:02  minutes from the cecum and the procedure completed.  COMPLICATIONS:  None  ENDOSCOPIC IMPRESSION: 1) Diminutive polyp in the rectum - removed 2) Moderate diverticulosis found scattered throught the colon 3) Internal hemorrhoids  RECOMMENDATIONS: 1) Follow up colonoscopy in 5 years  ______________________________ Wilhemina Bonito. Eda Keys, MD  CC:  Kari Baars, MD;  The Patient  n. eSIGNED:   Wilhemina Bonito. Eda Keys at 08/27/2011 12:25 PM  Spero Curb, [phone]

## 2011-08-28 ENCOUNTER — Telehealth: Payer: Self-pay | Admitting: *Deleted

## 2011-08-28 NOTE — Telephone Encounter (Signed)
  Follow up Call-  Call back number 08/27/2011  Post procedure Call Back phone  # [phone] hm  Permission to leave phone message Yes     Patient questions:  Do you have a fever, pain , or abdominal swelling? no Pain Score  0 *  Have you tolerated food without any problems? yes  Have you been able to return to your normal activities? yes  Do you have any questions about your discharge instructions: Diet   no Medications  no Follow up visit  no  Do you have questions or concerns about your Care? no  Actions: * If pain score is 4 or above: No action needed, pain <4.

## 2011-09-02 ENCOUNTER — Encounter: Payer: Self-pay | Admitting: Internal Medicine

## 2011-09-27 ENCOUNTER — Encounter: Payer: Self-pay | Admitting: Cardiology

## 2011-10-24 ENCOUNTER — Encounter: Payer: Self-pay | Admitting: Cardiology

## 2011-10-24 ENCOUNTER — Ambulatory Visit (INDEPENDENT_AMBULATORY_CARE_PROVIDER_SITE_OTHER): Payer: Medicare Other | Admitting: Cardiology

## 2011-10-24 DIAGNOSIS — E8770 Fluid overload, unspecified: Secondary | ICD-10-CM

## 2011-10-24 DIAGNOSIS — E8779 Other fluid overload: Secondary | ICD-10-CM

## 2011-10-24 DIAGNOSIS — I428 Other cardiomyopathies: Secondary | ICD-10-CM

## 2011-10-24 MED ORDER — SPIRONOLACTONE 25 MG PO TABS: 25.0000 mg | ORAL_TABLET | Freq: Every day | ORAL | Status: DC

## 2011-10-24 NOTE — Assessment & Plan Note (Signed)
The patient has a severe cardiomyopathy. His meds have been maximized. His heart rate is controlled with full dose carvedilol. His blood pressure is borderline low with full dose ACE inhibitor. Spironolactone has been added. We will increase the dose to 25 mg daily. Early followup labs will be done.  I discussed the concept of ICD with the patient again today. He continues to not want to proceed with this. He understands the rationale for my recommending it. I will see him back in 6 months for followup.

## 2011-10-24 NOTE — Assessment & Plan Note (Signed)
His volume status is stable. No change in therapy. 

## 2011-10-24 NOTE — Patient Instructions (Signed)
Your physician wants you to follow-up in: 6 months.   You will receive a reminder letter in the mail two months in advance. If you don't receive a letter, please call our office to schedule the follow-up appointment.  Your physician has recommended you make the following change in your medication: Increase your spironolactone to 25mg  daily  Your physician recommends that you return for lab work in: one week

## 2011-10-24 NOTE — Progress Notes (Signed)
HPI   Patient is seen to followup cardiomyopathy. He has done remarkably well over time. He has severe left ventricular dysfunction. His ejection fraction is in the 15% range. He has significant dilatation of the ventricle. He has repeatedly refused an ICD over time. Historically I have been able to use good doses of medications for his cardiomyopathy.At the time of his last visit we did start spironolactone. He had been started on 12.5 mg and this is the dose that he is still on at this time. He tolerated this well. He had followup labs that were stable. On September 18, 2011 BUN was 14, creatinine 1.1, potassium 4.4.  The patient says that he is active and has no complaints.    No Known Allergies  Current Outpatient Prescriptions  Medication Sig Dispense Refill  . aspirin 81 MG tablet Take 81 mg by mouth daily.        . carvedilol (COREG) 25 MG tablet TAKE ONE TABLET TWICE DAILY  60 tablet  11  . fluticasone (VERAMYST) 27.5 MCG/SPRAY nasal spray 2 sprays by Nasal route daily.        . fosinopril (MONOPRIL) 40 MG tablet TAKE ONE TABLET DAILY  30 tablet  11  . furosemide (LASIX) 40 MG tablet Take 40 mg by mouth 2 (two) times daily.        . rosuvastatin (CRESTOR) 40 MG tablet Take 40 mg by mouth daily.        Marland Kitchen spironolactone (ALDACTONE) 12.5 mg TABS Take 0.5 tablets (12.5 mg total) by mouth daily.  45 tablet  2   Current Facility-Administered Medications  Medication Dose Route Frequency Provider Last Rate Last Dose  . 0.9 %  sodium chloride infusion  500 mL Intravenous Continuous Hilarie Fredrickson, MD        History   Social History  . Marital Status: Widowed    Spouse Name: N/A    Number of Children: 3  . 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 Smoking status: Former Smoker    Types: Cigarettes    Quit date: 06/17/2010  . Smokeless tobacco: Never Used  . Alcohol Use: 1.5 oz/week    3 drink(s) per week  . Drug Use: No   hx of clean for 20+ years  . Sexually Active: Not on file   Other Topics Concern  . Not on file   Social History Narrative  . No narrative on file    Family History  Problem Relation Age of Onset  . Diabetes Mother   . Colon cancer Neg Hx   . Esophageal cancer Neg Hx   . Stomach cancer Neg Hx   . Rectal cancer Neg Hx     Past Medical History  Diagnosis Date  . Pneumonia 11/01  . Cardiomyopathy     refused cath and refused icd/echo.Marland KitchenMarland Kitchen6/2009 10%  LV .Marland Kitchen.59mm+  . Volume overload   . H/O: drug dependency     In the past., clean for greater than 20 years  . Ejection fraction < 50%     EF 10%, echo, June, 2009, LV 75 mm  . Mitral regurgitation     Mild by history  . Right ventricular dysfunction     Mild to moderate, echo, June, 2009  . Tobacco abuse   . Systolic CHF, chronic     Patient is refusing ICD over the years  . Substance abuse  Past Surgical History  Procedure Date  . Colonoscopy   . Ingunial hernia repair x2     ROS Patient denies fever, chills, rash, Change in vision, change in hearing, chest pain, cough, nausea vomiting, urinary symptoms. All other systems are reviewed and are negative.  PHYSICAL EXAM  The patient is oriented to person time and place. Affect is normal. His muscle mass is wasting compatible with cardiac cachexia. He does not have any complaints. There is no jugulovenous distention. There are no carotid bruits. Lungs are clear. Respiratory effort is not labored. Cardiac exam reveals an S1 and S2. There no clicks or significant murmurs. The abdomen is mildly protuberant but soft. There is no significant peripheral edema.  Filed Vitals:   10/24/11 1355  BP: 92/54  Pulse: 64  Resp: 28  Height: 5\' 11"  (1.803 m)  Weight: 165 lb (74.844 kg)   EKG is done today and reviewed by me. There is sinus rhythm. There is increased voltage. There are marked ST-T wave changes compatible with left ventricular hypertrophy. There is no significant  change.  ASSESSMENT & PLAN

## 2011-10-28 NOTE — Progress Notes (Signed)
Rx called to [facility] pharmacy.

## 2011-10-28 NOTE — Progress Notes (Signed)
Addended by: Vista Mink D on: 10/28/2011 04:11 PM   Modules accepted: Orders

## 2011-10-31 ENCOUNTER — Other Ambulatory Visit (INDEPENDENT_AMBULATORY_CARE_PROVIDER_SITE_OTHER): Payer: Medicare Other

## 2011-10-31 DIAGNOSIS — I428 Other cardiomyopathies: Secondary | ICD-10-CM

## 2011-10-31 LAB — BASIC METABOLIC PANEL
CO2: 29 mEq/L (ref 19–32)
Calcium: 9.9 mg/dL (ref 8.4–10.5)
Glucose, Bld: 89 mg/dL (ref 70–99)
Potassium: 4.1 mEq/L (ref 3.5–5.1)
Sodium: 142 mEq/L (ref 135–145)

## 2011-11-12 ENCOUNTER — Other Ambulatory Visit: Payer: Self-pay | Admitting: Cardiology

## 2011-11-12 ENCOUNTER — Encounter: Payer: Self-pay | Admitting: Cardiology

## 2011-11-12 NOTE — Progress Notes (Unsigned)
The patient had been on 12.5 mg of spironolactone and his labs were stable. I increased him to 25 mg daily. Followup labs on May 16 showed some mild increase in BUN and creatinine. However I do not like this trend. He will be cut back to 12.5 mg daily.

## 2011-11-13 ENCOUNTER — Other Ambulatory Visit: Payer: Self-pay

## 2011-11-13 ENCOUNTER — Other Ambulatory Visit: Payer: Self-pay | Admitting: Cardiology

## 2011-11-13 DIAGNOSIS — E8770 Fluid overload, unspecified: Secondary | ICD-10-CM

## 2011-11-13 NOTE — Progress Notes (Signed)
Pt was notified.  Lab was ordered and scheduled.  Pt was notified of date and time of labs.

## 2011-11-27 ENCOUNTER — Other Ambulatory Visit (INDEPENDENT_AMBULATORY_CARE_PROVIDER_SITE_OTHER): Payer: Medicare Other

## 2011-11-27 DIAGNOSIS — E8779 Other fluid overload: Secondary | ICD-10-CM

## 2011-11-27 DIAGNOSIS — E8770 Fluid overload, unspecified: Secondary | ICD-10-CM

## 2011-11-27 LAB — BASIC METABOLIC PANEL
BUN: 12 mg/dL (ref 6–23)
CO2: 28 mEq/L (ref 19–32)
Chloride: 100 mEq/L (ref 96–112)
Potassium: 4.1 mEq/L (ref 3.5–5.1)

## 2012-04-22 ENCOUNTER — Encounter: Payer: Self-pay | Admitting: Cardiology

## 2012-04-22 ENCOUNTER — Ambulatory Visit (INDEPENDENT_AMBULATORY_CARE_PROVIDER_SITE_OTHER): Payer: Medicare Other | Admitting: Cardiology

## 2012-04-22 VITALS — BP 133/90 | HR 73 | Ht 61.32 in | Wt 162.0 lb

## 2012-04-22 DIAGNOSIS — I340 Nonrheumatic mitral (valve) insufficiency: Secondary | ICD-10-CM

## 2012-04-22 DIAGNOSIS — E8770 Fluid overload, unspecified: Secondary | ICD-10-CM

## 2012-04-22 DIAGNOSIS — I428 Other cardiomyopathies: Secondary | ICD-10-CM

## 2012-04-22 DIAGNOSIS — I059 Rheumatic mitral valve disease, unspecified: Secondary | ICD-10-CM

## 2012-04-22 DIAGNOSIS — I429 Cardiomyopathy, unspecified: Secondary | ICD-10-CM

## 2012-04-22 DIAGNOSIS — E8779 Other fluid overload: Secondary | ICD-10-CM

## 2012-04-22 MED ORDER — SPIRONOLACTONE 25 MG PO TABS: 12.5000 mg | ORAL_TABLET | Freq: Every day | ORAL | Status: DC

## 2012-04-22 NOTE — Progress Notes (Signed)
Patient ID: Paul Bradsher., male   DOB: Feb 04, 1941, 71 y.o.   MRN: [id number]   HPI  Patient is seen to follow up cardiomyopathy. He has a severe cardiomyopathy. Over the years he has refused catheterization and he has refused ICD. Fortunately he's done extremely well. We have him on an excellent regimen of medications. When I pushed his spironolactone from 12.5 up to 25 mg he did have some change in his BUN and creatinine. I chose to put him back to 12.5 mg daily. He feels well. He does not have chest pain or shortness of breath. He has no syncope or presyncope.  No Known Allergies  Current Outpatient Prescriptions  Medication Sig Dispense Refill  . aspirin 81 MG tablet Take 81 mg by mouth daily.        . carvedilol (COREG) 25 MG tablet TAKE ONE TABLET TWICE    DAILY  60 tablet  11  . fluticasone (VERAMYST) 27.5 MCG/SPRAY nasal spray 2 sprays by Nasal route daily.        . fosinopril (MONOPRIL) 40 MG tablet TAKE ONE TABLET DAILY  30 tablet  6  . furosemide (LASIX) 40 MG tablet Take 40 mg by mouth 2 (two) times daily.        . rosuvastatin (CRESTOR) 40 MG tablet Take 40 mg by mouth daily.        Marland Kitchen spironolactone (ALDACTONE) 25 MG tablet Take 1 tablet (25 mg total) by mouth daily.  30 tablet  5   Current Facility-Administered Medications  Medication Dose Route Frequency Provider Last Rate Last Dose  . 0.9 %  sodium chloride infusion  500 mL Intravenous Continuous Hilarie Fredrickson, MD        History   Social History  . Marital Status: Widowed    Spouse Name: N/A    Number of Children: 3  . 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 Smoking status: Former Smoker    Types: Cigarettes    Quit date: 06/17/2010  . Smokeless tobacco: Never Used  . Alcohol Use: 1.5 oz/week    3 drink(s) per week  . Drug Use: No     Comment: hx of clean for 20+ years  . Sexually Active: Not on file   Other Topics Concern  . Not on file   Social  History Narrative  . No narrative on file    Family History  Problem Relation Age of Onset  . Diabetes Mother   . Colon cancer Neg Hx   . Esophageal cancer Neg Hx   . Stomach cancer Neg Hx   . Rectal cancer Neg Hx     Past Medical History  Diagnosis Date  . Pneumonia 11/01  . Cardiomyopathy     refused cath and refused icd/echo.Marland KitchenMarland Kitchen6/2009 10%  LV .Marland Kitchen.73mm+  . Volume overload   . H/O: drug dependency     In the past., clean for greater than 20 years  . Ejection fraction < 50%     EF 10%, echo, June, 2009, LV 75 mm  . Mitral regurgitation     Mild by history  . Right ventricular dysfunction     Mild to moderate, echo, June, 2009  . Tobacco abuse   . Systolic CHF, chronic     Patient is refusing ICD over the years  . Substance abuse     Past Surgical History  Procedure Date  . Colonoscopy   .  Ingunial hernia repair x2     Patient Active Problem List  Diagnosis  . POLYCYTHEMIA  . Cardiomyopathy  . Volume overload  . Ejection fraction < 50%  . Mitral regurgitation  . Right ventricular dysfunction  . Tobacco abuse  . Systolic CHF, chronic    ROS   Patient denies fever, chills, headache, sweats, rash, change in vision, change in hearing, chest pain, cough, nausea vomiting, urinary symptoms. All other systems are reviewed and are negative.  PHYSICAL EXAM  Patient is stable. He is oriented to person time and place. Affect is normal. There is no jugulovenous distention. Lungs are clear. Respiratory effort is nonlabored. Cardiac exam reveals S1 and S2. There no clicks or significant murmurs. The abdomen is soft. Is no peripheral edema.  Filed Vitals:   04/22/12 1504  BP: 133/90  Pulse: 73  Height: 5' 1.32" (1.558 m)  Weight: 162 lb (73.483 kg)   EKG is done today. He has old diffuse ST changes. There sinus rhythm.  ASSESSMENT & PLAN

## 2012-04-22 NOTE — Assessment & Plan Note (Signed)
There is mild mitral regurgitation by history. No further workup

## 2012-04-22 NOTE — Assessment & Plan Note (Signed)
Volume status is stable. No change in therapy. 

## 2012-04-22 NOTE — Assessment & Plan Note (Signed)
Patient has severe cardiomyopathy. Etiology has never been known. The patient has refused any further workup. He takes his medicines very carefully and he does well. No change in therapy. In the past his blood pressure has been quite low. The diastolic is increased today. However I'm not convinced that this is regular for him. I'm hesitant to push his medicines any further.

## 2012-04-22 NOTE — Patient Instructions (Addendum)
Your physician wants you to follow-up in:  6 months. You will receive a reminder letter in the mail two months in advance. If you don't receive a letter, please call our office to schedule the follow-up appointment.   

## 2012-05-13 ENCOUNTER — Other Ambulatory Visit: Payer: Self-pay

## 2012-05-13 ENCOUNTER — Telehealth: Payer: Self-pay | Admitting: Cardiology

## 2012-05-13 MED ORDER — CARVEDILOL 25 MG PO TABS: 25.0000 mg | ORAL_TABLET | Freq: Two times a day (BID) | ORAL | Status: DC

## 2012-05-13 NOTE — Telephone Encounter (Signed)
Pt needs carvedilol refill at [facility] [place]

## 2012-06-11 ENCOUNTER — Other Ambulatory Visit: Payer: Self-pay | Admitting: *Deleted

## 2012-06-11 MED ORDER — FOSINOPRIL SODIUM 40 MG PO TABS: 40.0000 mg | ORAL_TABLET | Freq: Every day | ORAL | Status: DC

## 2012-06-26 ENCOUNTER — Telehealth: Payer: Self-pay | Admitting: *Deleted

## 2012-06-26 NOTE — Telephone Encounter (Signed)
Message received on triage phone in clinic from [place] from [facility] / Case manager for heart failure, requesting last recorded EF. Called her back and left message that echo from 12/07/2007 showed an EF of 5 to 10 %.  He phone number was [phone] ex [phone].

## 2012-09-30 ENCOUNTER — Encounter: Payer: Self-pay | Admitting: Cardiology

## 2012-10-22 ENCOUNTER — Encounter: Payer: Self-pay | Admitting: Cardiology

## 2012-10-22 ENCOUNTER — Ambulatory Visit (INDEPENDENT_AMBULATORY_CARE_PROVIDER_SITE_OTHER): Payer: Medicare Other | Admitting: Cardiology

## 2012-10-22 VITALS — BP 92/64 | HR 60 | Ht 61.0 in | Wt 161.0 lb

## 2012-10-22 DIAGNOSIS — I5022 Chronic systolic (congestive) heart failure: Secondary | ICD-10-CM

## 2012-10-22 DIAGNOSIS — I509 Heart failure, unspecified: Secondary | ICD-10-CM

## 2012-10-22 DIAGNOSIS — I428 Other cardiomyopathies: Secondary | ICD-10-CM

## 2012-10-22 DIAGNOSIS — I429 Cardiomyopathy, unspecified: Secondary | ICD-10-CM

## 2012-10-22 NOTE — Progress Notes (Signed)
HPI  Patient is seen today to follow up cardiomyopathy. I had seen him last November, 2013. I have been trying to use spironolactone. He seemed to tolerated 12.5 mg. When the dose was increased to 25 mg he had some changes in his BUN and creatinine. At that time I chose to put him back on 12.5 mg. Since that time the med has been stopped. I do not know exactly when it was stopped. However I do know that his potassium was recently high. This is being followed by his primary physician. Therefore would not seem appropriate to try spironolactone again at this time.  The patient did have some increased fluid recently and his diuretic dose was increased. He feels better and is stable now.  No Known Allergies  Current Outpatient Prescriptions  Medication Sig Dispense Refill  . aspirin 81 MG tablet Take 81 mg by mouth daily.        . carvedilol (COREG) 25 MG tablet Take 1 tablet (25 mg total) by mouth 2 (two) times daily with a meal.  60 tablet  11  . fluticasone (VERAMYST) 27.5 MCG/SPRAY nasal spray 2 sprays by Nasal route daily.        . fosinopril (MONOPRIL) 40 MG tablet Take 1 tablet (40 mg total) by mouth daily.  30 tablet  6  . furosemide (LASIX) 40 MG tablet Take 40 mg by mouth 2 (two) times daily.        . rosuvastatin (CRESTOR) 40 MG tablet Take 40 mg by mouth daily.         Current Facility-Administered Medications  Medication Dose Route Frequency Provider Last Rate Last Dose  . 0.9 %  sodium chloride infusion  500 mL Intravenous Continuous Hilarie Fredrickson, MD        History   Social History  . Marital Status: Widowed    Spouse Name: N/A    Number of Children: 3  . 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 Smoking status: Former Smoker    Types: Cigarettes    Quit date: 06/17/2010  . Smokeless tobacco: Never Used  . Alcohol Use: 1.5 oz/week    3 drink(s) per week  . Drug Use: No     Comment: hx of clean for 20+ years  .  Sexually Active: Not on file   Other Topics Concern  . Not on file   Social History Narrative  . No narrative on file    Family History  Problem Relation Age of Onset  . Diabetes Mother   . Colon cancer Neg Hx   . Esophageal cancer Neg Hx   . Stomach cancer Neg Hx   . Rectal cancer Neg Hx     Past Medical History  Diagnosis Date  . Pneumonia 11/01  . Cardiomyopathy     refused cath and refused icd/echo.Marland KitchenMarland Kitchen6/2009 10%  LV .Marland Kitchen.14mm+  . Volume overload   . H/O: drug dependency     In the past., clean for greater than 20 years  . Ejection fraction < 50%     EF 10%, echo, June, 2009, LV 75 mm  . Mitral regurgitation     Mild by history  . Right ventricular dysfunction     Mild to moderate, echo, June, 2009  . Tobacco abuse   . Systolic CHF, chronic     Patient is refusing ICD over the years  . Substance abuse  Past Surgical History  Procedure Laterality Date  . Colonoscopy    . Ingunial hernia repair x2      Patient Active Problem List   Diagnosis Date Noted  . Systolic CHF, chronic   . Cardiomyopathy   . Ejection fraction < 50%   . Mitral regurgitation   . Right ventricular dysfunction   . Tobacco abuse   . POLYCYTHEMIA 04/14/2009    ROS   Patient denies fever, chills, headache, sweats, rash, change in vision, change in hearing, chest pain, cough, nausea vomiting, urinary symptoms. All other systems are reviewed and are negative.  PHYSICAL EXAM  Patient is stable. He is oriented to person time and place. Affect is normal. There is no jugulovenous distention. Lungs are clear. Respiratory effort is nonlabored. Cardiac exam reveals S1 and S2. There no clicks or significant murmurs. The abdomen is soft. There is no peripheral edema.  Filed Vitals:   10/22/12 1024  BP: 92/64  Pulse: 60  Height: 5\' 1"  (1.549 m)  Weight: 161 lb (73.029 kg)  SpO2: 95%     ASSESSMENT & PLAN

## 2012-10-22 NOTE — Assessment & Plan Note (Signed)
Patient recently had some increased volume. His Lasix dose was adjusted. No change in therapy today. He has followup arranged with his primary physician.

## 2012-10-22 NOTE — Patient Instructions (Signed)
Your physician recommends that you continue on your current medications as directed. Please refer to the Current Medication list given to you today.  Your physician wants you to follow-up in: 6 months. You will receive a reminder letter in the mail two months in advance. If you don't receive a letter, please call our office to schedule the follow-up appointment.  

## 2012-10-22 NOTE — Assessment & Plan Note (Signed)
Patient on beta blocker and an ACE inhibitor. We tried spironolactone and it has been stopped. I'm not sure exactly when it was stopped. However with recent increase potassium in the primary care office I would not be inclined to try to restart it at this time. The patient has refused ICD in the past.

## 2013-01-14 ENCOUNTER — Encounter: Payer: Self-pay | Admitting: Cardiology

## 2013-01-26 ENCOUNTER — Other Ambulatory Visit: Payer: Self-pay

## 2013-01-26 MED ORDER — FOSINOPRIL SODIUM 40 MG PO TABS: 40.0000 mg | ORAL_TABLET | Freq: Every day | ORAL | Status: DC

## 2013-04-20 ENCOUNTER — Encounter: Payer: Self-pay | Admitting: Cardiology

## 2013-04-20 ENCOUNTER — Ambulatory Visit (INDEPENDENT_AMBULATORY_CARE_PROVIDER_SITE_OTHER): Payer: Medicare Other | Admitting: Cardiology

## 2013-04-20 VITALS — BP 106/60 | HR 48 | Ht 71.0 in | Wt 163.0 lb

## 2013-04-20 DIAGNOSIS — I428 Other cardiomyopathies: Secondary | ICD-10-CM

## 2013-04-20 DIAGNOSIS — I5022 Chronic systolic (congestive) heart failure: Secondary | ICD-10-CM

## 2013-04-20 DIAGNOSIS — I509 Heart failure, unspecified: Secondary | ICD-10-CM

## 2013-04-20 DIAGNOSIS — I429 Cardiomyopathy, unspecified: Secondary | ICD-10-CM

## 2013-04-20 NOTE — Assessment & Plan Note (Signed)
He has chronic systolic heart failure. His diuretic dose had been increased slightly before the my prior visit with him. He's been stable. No change in therapy.

## 2013-04-20 NOTE — Patient Instructions (Signed)
Your physician recommends that you continue on your current medications as directed. Please refer to the Current Medication list given to you today.  Your physician wants you to follow-up in: 1 year. You will receive a reminder letter in the mail two months in advance. If you don't receive a letter, please call our office to schedule the follow-up appointment.  

## 2013-04-20 NOTE — Assessment & Plan Note (Signed)
His cardiomyopathy is severe. He has never wanted to have cath or ICD. He is on all  appropriate medicines that can be used. No change in therapy.

## 2013-04-20 NOTE — Progress Notes (Signed)
HPI  Patient is seen today to followup his cardiomyopathy. I have followed him for many years. He has never agreed to catheterization. We do not know the exact etiology of his cardiomyopathy. I've carefully adjusted his meds. We attempted spironolactone. Ultimately he was stopped. I think this was related to hyperkalemia. He does remarkably well. He is not having chest pain or shortness of breath. There has been no syncope or presyncope. Historically I have recommended an ICD to him each time I've seen him. Each time he again decides that he does not want an ICD.  No Known Allergies  Current Outpatient Prescriptions  Medication Sig Dispense Refill  . aspirin 81 MG tablet Take 81 mg by mouth daily.        . carvedilol (COREG) 25 MG tablet Take 1 tablet (25 mg total) by mouth 2 (two) times daily with a meal.  60 tablet  11  . fluticasone (VERAMYST) 27.5 MCG/SPRAY nasal spray 2 sprays by Nasal route daily.        . fosinopril (MONOPRIL) 40 MG tablet Take 1 tablet (40 mg total) by mouth daily.  90 tablet  3  . furosemide (LASIX) 40 MG tablet Take 40 mg by mouth 2 (two) times daily.        . rosuvastatin (CRESTOR) 40 MG tablet Take 40 mg by mouth daily.         Current Facility-Administered Medications  Medication Dose Route Frequency Provider Last Rate Last Dose  . 0.9 %  sodium chloride infusion  500 mL Intravenous Continuous Hilarie Fredrickson, MD        History   Social History  . Marital Status: Widowed    Spouse Name: N/A    Number of Children: 3  . 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 Smoking status: Former Smoker    Types: Cigarettes    Quit date: 06/17/2010  . Smokeless tobacco: Never Used  . Alcohol Use: 1.5 oz/week    3 drink(s) per week  . Drug Use: No     Comment: hx of clean for 20+ years  . Sexual Activity: Not on file   Other Topics Concern  . Not on file   Social History Narrative  . No narrative on  file    Family History  Problem Relation Age of Onset  . Diabetes Mother   . Colon cancer Neg Hx   . Esophageal cancer Neg Hx   . Stomach cancer Neg Hx   . Rectal cancer Neg Hx     Past Medical History  Diagnosis Date  . Pneumonia 11/01  . Cardiomyopathy     refused cath and refused icd/echo.Marland KitchenMarland Kitchen6/2009 10%  LV .Marland Kitchen.58mm+  . Volume overload   . H/O: drug dependency     In the past., clean for greater than 20 years  . Ejection fraction < 50%     EF 10%, echo, June, 2009, LV 75 mm  . Mitral regurgitation     Mild by history  . Right ventricular dysfunction     Mild to moderate, echo, June, 2009  . Tobacco abuse   . Systolic CHF, chronic     Patient is refusing ICD over the years  . Substance abuse     Past Surgical History  Procedure Laterality Date  . Colonoscopy    . Ingunial hernia repair x2      Patient Active Problem List  Diagnosis Date Noted  . Systolic CHF, chronic   . Cardiomyopathy   . Ejection fraction < 50%   . Mitral regurgitation   . Right ventricular dysfunction   . Tobacco abuse   . POLYCYTHEMIA 04/14/2009    ROS   Patient denies fever, chills, headache, sweats, rash, change in vision, change in hearing, chest pain, cough, nausea or vomiting, urinary symptoms. All other systems are reviewed and are negative.  PHYSICAL EXAM  Patient is oriented to person time and place. Affect is normal. There is no jugulovenous distention. Lungs are clear. Respiratory effort is nonlabored. Cardiac exam reveals S1 and S2. There no clicks or significant murmurs. The abdomen is soft. There is no peripheral edema.  Filed Vitals:   04/20/13 1207  BP: 106/60  Pulse: 48  Height: 5\' 11"  (1.803 m)  Weight: 163 lb (73.936 kg)   EKG is done today and reviewed by me. There is sinus bradycardia. There is no change in the QRS.  ASSESSMENT & PLAN

## 2013-05-17 ENCOUNTER — Other Ambulatory Visit (HOSPITAL_COMMUNITY): Payer: Self-pay | Admitting: Cardiovascular Disease

## 2013-06-05 ENCOUNTER — Emergency Department (HOSPITAL_COMMUNITY)
Admission: EM | Admit: 2013-06-05 | Discharge: 2013-06-05 | Disposition: A | Discharge status: Home / Self Care | Payer: Medicare Other | Attending: Emergency Medicine | Admitting: Emergency Medicine

## 2013-06-05 ENCOUNTER — Encounter (HOSPITAL_COMMUNITY): Payer: Self-pay | Admitting: Emergency Medicine

## 2013-06-05 ENCOUNTER — Emergency Department (HOSPITAL_COMMUNITY): Payer: Medicare Other

## 2013-06-05 DIAGNOSIS — R05 Cough: Secondary | ICD-10-CM | POA: Insufficient documentation

## 2013-06-05 DIAGNOSIS — Z8673 Personal history of transient ischemic attack (TIA), and cerebral infarction without residual deficits: Secondary | ICD-10-CM | POA: Insufficient documentation

## 2013-06-05 DIAGNOSIS — H538 Other visual disturbances: Secondary | ICD-10-CM | POA: Insufficient documentation

## 2013-06-05 DIAGNOSIS — I5022 Chronic systolic (congestive) heart failure: Secondary | ICD-10-CM | POA: Insufficient documentation

## 2013-06-05 DIAGNOSIS — R609 Edema, unspecified: Secondary | ICD-10-CM | POA: Insufficient documentation

## 2013-06-05 DIAGNOSIS — IMO0002 Reserved for concepts with insufficient information to code with codable children: Secondary | ICD-10-CM | POA: Insufficient documentation

## 2013-06-05 DIAGNOSIS — Z7982 Long term (current) use of aspirin: Secondary | ICD-10-CM | POA: Insufficient documentation

## 2013-06-05 DIAGNOSIS — Z79899 Other long term (current) drug therapy: Secondary | ICD-10-CM | POA: Insufficient documentation

## 2013-06-05 DIAGNOSIS — Z87891 Personal history of nicotine dependence: Secondary | ICD-10-CM | POA: Insufficient documentation

## 2013-06-05 DIAGNOSIS — Z8701 Personal history of pneumonia (recurrent): Secondary | ICD-10-CM | POA: Insufficient documentation

## 2013-06-05 DIAGNOSIS — R059 Cough, unspecified: Secondary | ICD-10-CM | POA: Insufficient documentation

## 2013-06-05 DIAGNOSIS — I509 Heart failure, unspecified: Secondary | ICD-10-CM

## 2013-06-05 HISTORY — DX: Cerebral infarction, unspecified: I63.9

## 2013-06-05 LAB — COMPREHENSIVE METABOLIC PANEL
ALT: 33 U/L (ref 0–53)
AST: 44 U/L — ABNORMAL HIGH (ref 0–37)
CO2: 26 mEq/L (ref 19–32)
Calcium: 9.1 mg/dL (ref 8.4–10.5)
Creatinine, Ser: 1.03 mg/dL (ref 0.50–1.35)
GFR calc Af Amer: 81 mL/min — ABNORMAL LOW (ref >90)
Glucose, Bld: 126 mg/dL — ABNORMAL HIGH (ref 70–99)
Potassium: 4.3 mEq/L (ref 3.5–5.1)
Sodium: 139 mEq/L (ref 135–145)
Total Bilirubin: 1.1 mg/dL (ref 0.3–1.2)
Total Protein: 7.1 g/dL (ref 6.0–8.3)

## 2013-06-05 LAB — PRO B NATRIURETIC PEPTIDE: Pro B Natriuretic peptide (BNP): 15922 pg/mL — ABNORMAL HIGH (ref 0–125)

## 2013-06-05 LAB — CBC
Hemoglobin: 17.7 g/dL — ABNORMAL HIGH (ref 13.0–17.0)
MCHC: 34 g/dL (ref 30.0–36.0)
Platelets: 148 10*3/uL — ABNORMAL LOW (ref 150–400)
RDW: 16.6 % — ABNORMAL HIGH (ref 11.5–15.5)

## 2013-06-05 LAB — POCT I-STAT TROPONIN I

## 2013-06-05 MED ORDER — FUROSEMIDE 10 MG/ML IJ SOLN
80.0000 mg | Freq: Once | INTRAMUSCULAR | Status: AC
  Administered 2013-06-05: 80 mg via INTRAVENOUS
  Filled 2013-06-05: qty 8

## 2013-06-05 NOTE — ED Notes (Signed)
Patient transported to X-ray 

## 2013-06-05 NOTE — ED Provider Notes (Addendum)
CSN: [id number]     Arrival date & time 06/05/13  1219 History   First MD Initiated Contact with Patient 06/05/13 1232     Chief Complaint  Patient presents with  . Shortness of Breath  . Loss of Vision   (Consider location/radiation/quality/duration/timing/severity/associated sxs/prior Treatment) Patient is a 72 y.o. male presenting with shortness of breath. The history is provided by the patient.  Shortness of Breath Severity:  Moderate Onset quality:  Gradual Duration:  1 week Timing:  Constant Progression:  Worsening Chronicity:  New Context comment:  Ran out of lasix last week and now very sob Relieved by:  Rest and sitting up Worsened by:  Activity Ineffective treatments:  None tried Associated symptoms: cough   Associated symptoms: no abdominal pain, no chest pain, no fever, no headaches, no sputum production, no syncope, no vomiting and no wheezing   Associated symptoms comment:  Blurred vision Risk factors: no recent alcohol use, no recent surgery and no tobacco use     Past Medical History  Diagnosis Date  . Pneumonia 11/01  . Cardiomyopathy     refused cath and refused icd/echo.Marland KitchenMarland Kitchen6/2009 10%  LV .Marland Kitchen.20mm+  . Volume overload   . H/O: drug dependency     In the past., clean for greater than 20 years  . Ejection fraction < 50%     EF 10%, echo, June, 2009, LV 75 mm  . Mitral regurgitation     Mild by history  . Right ventricular dysfunction     Mild to moderate, echo, June, 2009  . Tobacco abuse   . Systolic CHF, chronic     Patient is refusing ICD over the years  . Substance abuse   . Stroke    Past Surgical History  Procedure Laterality Date  . Colonoscopy    . Ingunial hernia repair x2     Family History  Problem Relation Age of Onset  . Diabetes Mother   . Colon cancer Neg Hx   . Esophageal cancer Neg Hx   . Stomach cancer Neg Hx   . Rectal cancer Neg Hx    History  Substance Use Topics  . Smoking status: Former Smoker    Types: Cigarettes     Quit date: 06/17/2010  . Smokeless tobacco: Never Used  . Alcohol Use: 1.5 oz/week    3 drink(s) per week    Review of Systems  Constitutional: Negative for fever.  Eyes: Positive for visual disturbance.       Blurred vision but no loss of vision  Respiratory: Positive for cough and shortness of breath. Negative for sputum production and wheezing.   Cardiovascular: Negative for chest pain and syncope.  Gastrointestinal: Negative for vomiting and abdominal pain.  Neurological: Negative for weakness and headaches.  All other systems reviewed and are negative.    Allergies  Review of patient's allergies indicates no known allergies.  Home Medications   Current Outpatient Rx  Name  Route  Sig  Dispense  Refill  . aspirin 81 MG tablet   Oral   Take 81 mg by mouth daily.           . carvedilol (COREG) 25 MG tablet      take 1 tablet by mouth twice a day with meals   60 tablet   11   . fluticasone (VERAMYST) 27.5 MCG/SPRAY nasal spray   Nasal   2 sprays by Nasal route daily.           . fosinopril (  MONOPRIL) 40 MG tablet   Oral   Take 1 tablet (40 mg total) by mouth daily.   90 tablet   3   . furosemide (LASIX) 40 MG tablet   Oral   Take 40 mg by mouth 2 (two) times daily.           . rosuvastatin (CRESTOR) 40 MG tablet   Oral   Take 40 mg by mouth daily.            BP 143/104  Pulse 78  Resp 26  SpO2 96% Physical Exam  Nursing note and vitals reviewed. Constitutional: He is oriented to person, place, and time. He appears well-developed and well-nourished. No distress.  HENT:  Head: Normocephalic and atraumatic.  Mouth/Throat: Oropharynx is clear and moist.  Eyes: Conjunctivae and EOM are normal. Pupils are equal, round, and reactive to light.  Neck: Normal range of motion. Neck supple.  Cardiovascular: Normal rate, regular rhythm and intact distal pulses.   No murmur heard. Pulmonary/Chest: Effort normal. No respiratory distress. He has no  wheezes. He has rales.  Abdominal: Soft. He exhibits no distension. There is no tenderness. There is no rebound and no guarding.  Musculoskeletal: Normal range of motion. He exhibits edema. He exhibits no tenderness.  2+ edema bilaterally  Neurological: He is alert and oriented to person, place, and time.  Left visual field cut which is chronic.  No focal weakness or sensory deficits.  Normal speech  Skin: Skin is warm and dry. No rash noted. No erythema.  Psychiatric: He has a normal mood and affect. His behavior is normal.    ED Course  Procedures (including critical care time) Labs Review Labs Reviewed  CBC - Abnormal; Notable for the following:    RBC 6.00 (*)    Hemoglobin 17.7 (*)    HCT 52.1 (*)    RDW 16.6 (*)    Platelets 148 (*)    All other components within normal limits  PRO B NATRIURETIC PEPTIDE - Abnormal; Notable for the following:    Pro B Natriuretic peptide (BNP) 15922.0 (*)    All other components within normal limits  COMPREHENSIVE METABOLIC PANEL - Abnormal; Notable for the following:    Glucose, Bld 126 (*)    AST 44 (*)    GFR calc non Af Amer 70 (*)    GFR calc Af Amer 81 (*)    All other components within normal limits  POCT I-STAT TROPONIN I   Imaging Review Dg Chest 2 View  06/05/2013   CLINICAL DATA:  Shortness of breath. Blurry vision. Cough and congestion.  EXAM: CHEST  2 VIEW  COMPARISON:  Chest x-ray 04/24/2008.  FINDINGS: There is cephalization of the pulmonary vasculature and slight indistinctness of the interstitial markings suggestive of mild pulmonary edema. No acute consolidative airspace disease. No definite pleural effusions. Moderate cardiomegaly. Upper mediastinal contours are within normal limits allowing for patient positioning. Atherosclerosis in the thoracic aorta.  IMPRESSION: 1. Findings concerning for mild congestive heart failure, as above. 2. Atherosclerosis.   Electronically Signed   By: Trudie Reed M.D.   On: 06/05/2013  13:36   Ct Head Wo Contrast  06/05/2013   CLINICAL DATA:  New onset visual disturbances, history of CVA and CHF.  EXAM: CT HEAD WITHOUT CONTRAST  TECHNIQUE: Contiguous axial images were obtained from the base of the skull through the vertex without intravenous contrast.  COMPARISON:  None.  FINDINGS: There is mild age appropriate diffuse cerebral and cerebellar  atrophy. There is a large area of encephalomalacia in the right mid and posterior parietal lobe with ex vacuo dilation of the posterior horn of the right lateral ventricle consistent with previous CVA. There is no shift of the midline. There is mild encephalomalacia in the left occipital lobe consistent with previous CVA. There is decreased density in the deep white matter of both cerebral hemispheres elsewhere consistent with chronic small vessel ischemic type change. There is no evidence of an acute intracranial hemorrhage. The cerebellum and brainstem are normal in appearance.  At bone window settings the observed portions of the paranasal sinuses and mastoid air cells are clear. There is no evidence of an acute skull fracture.  IMPRESSION: 1. There is no evidence of an acute intracranial hemorrhage nor of an acute evolving ischemic infarction. 2. There is a large area of encephalomalacia in the right mid and posterior parietal lobe and a smaller focus of encephalomalacia in the left occipital lobe consistent with previous CVAs. 3. There decreased densities within the deep white matter of both cerebral hemispheres consistent with chronic small vessel ischemia. 4. There is no evidence of an acute skull fracture nor acute inflammation of the visualized portions of the paranasal sinuses.   Electronically Signed   By: David  [place]   On: 06/05/2013 13:18    EKG Interpretation   None      Date: 06/05/2013  Rate: 79  Rhythm: normal sinus rhythm  QRS Axis: left  Intervals: normal  ST/T Wave abnormalities: nonspecific ST/T changes  Conduction  Disutrbances:first-degree A-V block   Narrative Interpretation:   Old EKG Reviewed: unchanged    MDM   1. CHF exacerbation     Patient with a history of CHF, cardiomegaly and a diagnosis of volume overload he takes Lasix 80 mg daily he states that approximately one week ago his Lasix ran out and he is still trying to refill the prescription. Since that time over the last 1 week he's had progressive shortness of breath, leg swelling and today woke up complaining of bilateral mild blurry vision. He denies any vision loss, eye pain or headache. No recent trauma. Patient has no findings on exam concerning for stroke. He does have a visual field cut it in the left lateral eye which he states is chronic from her prior stroke.  Rales and lower extremity edema on exam. Concern for CHF exacerbation do to lack of Lasix. Also per family member patient has been eating a lot of salt.  CBC, BNP, CMP, troponin, chest x-ray, head CT pending. EKG is unchanged from prior  2:02 PM No acute findings on head CT.  Labs consistent with CHF.  However pt in no distress and feel he may be able to get IV lasix and d/c home after observation and increased urine output.  Pt opts to go home if he feels better and f/u with PCP on Monday.  Family confirmed they have his lasix ready.  3:40 PM Pt has had 1L output and feeling better.  Will ambulate to see how he feels.  3:51 PM Pt able to ambulate and feels better.  Will d/c home ot continue lasix and f/u with Dr. Clelia Croft Monday.  Gwyneth Sprout, MD 06/05/13 1546  Gwyneth Sprout, MD 06/05/13 1610

## 2013-06-05 NOTE — ED Notes (Addendum)
Reports waking up this am with vision changes, blurred vision to bilateral eyes. Reports vision was normal when he went to bed last night, grips are equal, no arm drift noted at triage. Having sob and swelling to bilateral feet.

## 2013-06-05 NOTE — ED Notes (Signed)
Patient discharged to home with family. NAD.  

## 2013-06-07 ENCOUNTER — Telehealth: Payer: Self-pay | Admitting: Cardiology

## 2013-06-07 NOTE — Telephone Encounter (Signed)
New Message  Pt friend called states that the pt was seen in the Hospital for S.O.B and Loss of Vision.. Made EPH appt for 06/18/2013.Marland Kitchen She is also stating that he fell 12/21 night and is exhibiting memory loss. Request a call back to discuss.

## 2013-06-07 NOTE — Telephone Encounter (Signed)
After reviewing ER notes from 12/21 [name] is advised to contact the pts PCP, Dr Clelia Croft. She verbalized understanding. We have scheduled a EPH f/u with Dr Myrtis Ser for 06/18/13.

## 2013-06-18 ENCOUNTER — Ambulatory Visit (INDEPENDENT_AMBULATORY_CARE_PROVIDER_SITE_OTHER): Payer: Medicare PPO | Admitting: Physician Assistant

## 2013-06-18 ENCOUNTER — Encounter: Payer: Self-pay | Admitting: Physician Assistant

## 2013-06-18 ENCOUNTER — Other Ambulatory Visit (INDEPENDENT_AMBULATORY_CARE_PROVIDER_SITE_OTHER): Payer: Medicare PPO

## 2013-06-18 ENCOUNTER — Ambulatory Visit (INDEPENDENT_AMBULATORY_CARE_PROVIDER_SITE_OTHER)
Admission: RE | Admit: 2013-06-18 | Discharge: 2013-06-18 | Disposition: A | Discharge status: Home / Self Care | Payer: Commercial Managed Care - HMO | Source: Ambulatory Visit | Attending: Physician Assistant | Admitting: Physician Assistant

## 2013-06-18 VITALS — BP 130/80 | HR 60 | Ht 71.0 in | Wt 171.0 lb

## 2013-06-18 DIAGNOSIS — I5022 Chronic systolic (congestive) heart failure: Secondary | ICD-10-CM

## 2013-06-18 DIAGNOSIS — R410 Disorientation, unspecified: Secondary | ICD-10-CM

## 2013-06-18 DIAGNOSIS — R0602 Shortness of breath: Secondary | ICD-10-CM

## 2013-06-18 DIAGNOSIS — I429 Cardiomyopathy, unspecified: Secondary | ICD-10-CM

## 2013-06-18 DIAGNOSIS — I5023 Acute on chronic systolic (congestive) heart failure: Secondary | ICD-10-CM

## 2013-06-18 DIAGNOSIS — I428 Other cardiomyopathies: Secondary | ICD-10-CM

## 2013-06-18 DIAGNOSIS — F29 Unspecified psychosis not due to a substance or known physiological condition: Secondary | ICD-10-CM

## 2013-06-18 DIAGNOSIS — W19XXXA Unspecified fall, initial encounter: Secondary | ICD-10-CM

## 2013-06-18 DIAGNOSIS — I509 Heart failure, unspecified: Secondary | ICD-10-CM

## 2013-06-18 DIAGNOSIS — Y92009 Unspecified place in unspecified non-institutional (private) residence as the place of occurrence of the external cause: Secondary | ICD-10-CM

## 2013-06-18 LAB — BASIC METABOLIC PANEL
BUN: 12 mg/dL (ref 6–23)
CALCIUM: 9 mg/dL (ref 8.4–10.5)
CHLORIDE: 101 meq/L (ref 96–112)
CO2: 32 mEq/L (ref 19–32)
CREATININE: 1.1 mg/dL (ref 0.4–1.5)
GFR: 82.47 mL/min (ref >60.00)
Glucose, Bld: 100 mg/dL — ABNORMAL HIGH (ref 70–99)
Potassium: 4.2 mEq/L (ref 3.5–5.1)
Sodium: 139 mEq/L (ref 135–145)

## 2013-06-18 LAB — BRAIN NATRIURETIC PEPTIDE: PRO B NATRI PEPTIDE: 3019 pg/mL — AB (ref 0.0–100.0)

## 2013-06-18 MED ORDER — FUROSEMIDE 80 MG PO TABS: 80.0000 mg | ORAL_TABLET | Freq: Two times a day (BID) | ORAL | Status: DC

## 2013-06-18 MED ORDER — NITROGLYCERIN 0.4 MG SL SUBL: 0.4000 mg | SUBLINGUAL_TABLET | SUBLINGUAL | Status: AC | PRN

## 2013-06-18 MED ORDER — POTASSIUM CHLORIDE CRYS ER 20 MEQ PO TBCR: 20.0000 meq | EXTENDED_RELEASE_TABLET | Freq: Two times a day (BID) | ORAL | Status: DC

## 2013-06-18 NOTE — Progress Notes (Signed)
[facility], [address], [place]  [address] Phone: [phone] Fax:  [phone]  Date:  06/18/2013   ID:  Paul Iddings., DOB [birth year], MRN [id number]  PCP:  Paul Rouse, MD  Cardiologist:  Dr. Cleatis Potts     History of Present Illness: [name]. is a 73 y.o. male with a hx of severe cardiomyopathy, systolic CHF, tobacco abuse.  He has never agreed to cardiac cath or for ICD evaluation.  Echo (11/2007):  EF 5-10%, diff HK, mild LAE, mild to mod reduced RVSF.  Last seen by Dr. Cleatis Potts 04/2013.  He was recently seen in the ED 09/98/3382 with a/c systolic CHF.  CXR demonstrated mild CHF.  He had been out of his Lasix for a week.  He was Rx with IV Lasix in the ED and d/c to home.  He complained of blurry vision and Head CT was neg for anything acute.    He is here with his friend.  She helps with some of the hx.  The patient downplays his symptoms some.  He is still short of breath.  He is NYHA Class 3.  He sleeps on 2 pillows chronically.  He denies PND.  He continues to have LE edema.  His friend tells me that he reports falling recently.  He cannot remember what happened.  It is not certain if he passed out or not.  His friend also not that he continues to be confused at times.  He denies unilateral weakness, vision loss, facial droop or speech difficulty.    Recent Labs: 06/05/2013: ALT 33; Creatinine 1.03; Hemoglobin 17.7*; Potassium 4.3; Pro B Natriuretic peptide (BNP) 15922.0*   Wt Readings from Last 3 Encounters:  06/18/13 171 lb (77.565 kg)  04/20/13 163 lb (73.936 kg)  10/22/12 161 lb (73.029 kg)     Past Medical History  Diagnosis Date  . Pneumonia 11/01  . Cardiomyopathy     refused cath and refused icd/echo.Marland KitchenMarland Kitchen6/2009 10%  LV .Marland Kitchen.31mm+  . Volume overload   . H/O: drug dependency     In the past., clean for greater than 20 years  . Ejection fraction < 50%     EF 10%, echo, June, 2009, LV 75 mm  . Mitral regurgitation     Mild by history  .  Right ventricular dysfunction     Mild to moderate, echo, June, 2009  . Tobacco abuse   . Systolic CHF, chronic     Patient is refusing ICD over the years  . Substance abuse   . Stroke   . Polycythemia     Jak 2 Negative    Current Outpatient Prescriptions  Medication Sig Dispense Refill  . acetaminophen (TYLENOL) 500 MG tablet Take 500 mg by mouth every 6 (six) hours as needed.      Marland Kitchen aspirin 81 MG tablet Take 81 mg by mouth daily.        . carvedilol (COREG) 25 MG tablet Take 25 mg by mouth 2 (two) times daily with a meal.      . colchicine 0.6 MG tablet Take 0.6 mg by mouth every 8 (eight) hours as needed (for gout flares).      . fluticasone (FLONASE) 50 MCG/ACT nasal spray Place 2 sprays into the nose 2 (two) times daily.       . fosinopril (MONOPRIL) 40 MG tablet Take 40 mg by mouth daily.      . furosemide (LASIX) 40 MG tablet Take 40  mg by mouth 2 (two) times daily.        . nitroGLYCERIN (NITROSTAT) 0.4 MG SL tablet Place 0.4 mg under the tongue every 5 (five) minutes as needed for chest pain.      . rosuvastatin (CRESTOR) 40 MG tablet Take 40 mg by mouth daily.        Current Facility-Administered Medications  Medication Dose Route Frequency Provider Last Rate Last Dose  . 0.9 %  sodium chloride infusion  500 mL Intravenous Continuous Paul Shipper, MD        Allergies:   Review of patient's allergies indicates no known allergies.   Social History:  The patient  reports that he quit smoking about 3 years ago. His smoking use included Cigarettes. He smoked 0.00 packs per day. He has never used smokeless tobacco. He reports that he drinks about 1.5 ounces of alcohol per week. He reports that he does not use illicit drugs.   Family History:  The patient's family history includes Diabetes in his mother. There is no history of Colon cancer, Esophageal cancer, Stomach cancer, or Rectal cancer.   ROS:  Please see the history of present illness.   He has a non-productive cough.  He  has chronic rhinorrhea.   All other systems reviewed and negative.   PHYSICAL EXAM: VS:  BP 130/80  Pulse 60  Ht 5\' 11"  (1.803 m)  Wt 171 lb (77.565 kg)  BMI 23.86 kg/m2  SpO2 91% Well nourished, well developed, in no acute distress HEENT: normal Neck: minimally elevated JVD Cardiac:  normal S1, S2; RRR; no murmur; no S3 Lungs:  Decreased breath sounds; ? Faint crackles at the bases (L>R) Abd: distended  Ext: 1+ bilateral LE edema Skin: warm and dry Neuro:  AAO x 3, CNs 2-12 intact, bilateral upper and lower extremity strength equal  EKG:  NSR, HR 60, LAD, IVCD, poor R-wave progression, inferolateral T wave inversions, no change from prior tracings     ASSESSMENT AND PLAN:  1. Acute on Chronic Systolic CHF:  He remains volume overloaded.  He states that he was previously on Lasix 80 mg twice a day. After going to the emergency room 12/20, he resumed Lasix 40 mg twice a day. I will increase his Lasix back to 80 mg twice a day. I will add potassium 20 mEq twice a day. I will check a basic metabolic panel, BNP and chest x-ray today. He will be brought back early next week for a repeat basic metabolic panel. He will be brought back in early follow up. If he feels worse over the weekend, he should go to the emergency room. 2. Confusion: Etiology of this is not entirely clear. Recent head CT was nonacute. He is alert and oriented to person place and time. His neurologic exam is nonfocal. I offered referring him to neurology. He currently declines. He can otherwise followup with primary care. 3. S/p Fall: He reports a recent fall. He cannot remember what happened. It is not certain if he had a syncopal episode or not. He has refused ICD implantation the past. He has refused cardiac catheterization. At this point, I am not certain that placing him on an event monitor would be beneficial. Continue to monitor. 4. Cardiomyopathy:  Continue beta blocker, ACEI.  As noted, he has refused cardiac cath and  ICD in the past.  5. Disposition:  His friend has asked for help managing him at home.  I will refer him to [facility].  Follow  up with Dr. Cleatis Potts or me in 1 week.    Signed, [name], PA-C  06/18/2013 9:12 AM

## 2013-06-18 NOTE — Patient Instructions (Signed)
A REFILL FOR NTG WAS SENT IN TODAY  LAB WORK TODAY; BMET, BNP AND CHWST X-RAY TO BE DONE AT [facility] 06/21/13  YOU HAVE A FOLLOW UP APPT WITH DR. KATZ 06/25/13 @ 12:00 PM  You have been referred to [facility]; DX CHF 428.23, 428.22  INCREASE LASIX TO 80 MG TWICE DAILY; A NEW RX WAS SENT FOR THE 80 MG TABLET START POTASSIUM 20 MEQ TWICE DAILY

## 2013-06-21 ENCOUNTER — Other Ambulatory Visit (INDEPENDENT_AMBULATORY_CARE_PROVIDER_SITE_OTHER): Payer: Medicare PPO

## 2013-06-21 DIAGNOSIS — I429 Cardiomyopathy, unspecified: Secondary | ICD-10-CM

## 2013-06-21 DIAGNOSIS — I428 Other cardiomyopathies: Secondary | ICD-10-CM

## 2013-06-21 DIAGNOSIS — I5022 Chronic systolic (congestive) heart failure: Secondary | ICD-10-CM

## 2013-06-21 DIAGNOSIS — I5023 Acute on chronic systolic (congestive) heart failure: Secondary | ICD-10-CM

## 2013-06-21 LAB — BASIC METABOLIC PANEL
BUN: 13 mg/dL (ref 6–23)
CALCIUM: 9.2 mg/dL (ref 8.4–10.5)
CO2: 32 meq/L (ref 19–32)
Chloride: 101 mEq/L (ref 96–112)
Creatinine, Ser: 1.2 mg/dL (ref 0.4–1.5)
GFR: 78.41 mL/min (ref >60.00)
Glucose, Bld: 99 mg/dL (ref 70–99)
Potassium: 4 mEq/L (ref 3.5–5.1)
Sodium: 140 mEq/L (ref 135–145)

## 2013-06-22 ENCOUNTER — Other Ambulatory Visit: Payer: Self-pay | Admitting: *Deleted

## 2013-06-22 DIAGNOSIS — I255 Ischemic cardiomyopathy: Secondary | ICD-10-CM

## 2013-06-25 ENCOUNTER — Ambulatory Visit: Payer: Medicare PPO | Admitting: Cardiology

## 2013-06-29 ENCOUNTER — Other Ambulatory Visit (INDEPENDENT_AMBULATORY_CARE_PROVIDER_SITE_OTHER): Payer: Medicare PPO

## 2013-06-29 DIAGNOSIS — I255 Ischemic cardiomyopathy: Secondary | ICD-10-CM

## 2013-06-29 DIAGNOSIS — I2589 Other forms of chronic ischemic heart disease: Secondary | ICD-10-CM

## 2013-06-29 LAB — BASIC METABOLIC PANEL
BUN: 18 mg/dL (ref 6–23)
CO2: 31 mEq/L (ref 19–32)
CREATININE: 1.3 mg/dL (ref 0.4–1.5)
Calcium: 9.2 mg/dL (ref 8.4–10.5)
Chloride: 102 mEq/L (ref 96–112)
GFR: 69.43 mL/min (ref >60.00)
Glucose, Bld: 132 mg/dL — ABNORMAL HIGH (ref 70–99)
POTASSIUM: 4.5 meq/L (ref 3.5–5.1)
Sodium: 141 mEq/L (ref 135–145)

## 2013-07-18 DIAGNOSIS — I499 Cardiac arrhythmia, unspecified: Secondary | ICD-10-CM

## 2013-07-18 DIAGNOSIS — E875 Hyperkalemia: Secondary | ICD-10-CM

## 2013-07-18 HISTORY — DX: Cardiac arrhythmia, unspecified: I49.9

## 2013-07-18 HISTORY — DX: Hyperkalemia: E87.5

## 2013-07-22 ENCOUNTER — Inpatient Hospital Stay (HOSPITAL_COMMUNITY): Payer: Medicare PPO

## 2013-07-22 ENCOUNTER — Inpatient Hospital Stay (HOSPITAL_COMMUNITY)
Admission: EM | Admit: 2013-07-22 | Discharge: 2013-07-23 | DRG: 552 | Disposition: A | Discharge status: Home / Self Care | Payer: Medicare PPO | Attending: Internal Medicine | Admitting: Internal Medicine

## 2013-07-22 ENCOUNTER — Emergency Department (HOSPITAL_COMMUNITY): Payer: Medicare PPO

## 2013-07-22 ENCOUNTER — Encounter (HOSPITAL_COMMUNITY): Payer: Self-pay | Admitting: Emergency Medicine

## 2013-07-22 DIAGNOSIS — I429 Cardiomyopathy, unspecified: Secondary | ICD-10-CM | POA: Diagnosis present

## 2013-07-22 DIAGNOSIS — J4489 Other specified chronic obstructive pulmonary disease: Secondary | ICD-10-CM | POA: Diagnosis present

## 2013-07-22 DIAGNOSIS — I472 Ventricular tachycardia, unspecified: Secondary | ICD-10-CM | POA: Diagnosis present

## 2013-07-22 DIAGNOSIS — I428 Other cardiomyopathies: Secondary | ICD-10-CM | POA: Diagnosis present

## 2013-07-22 DIAGNOSIS — D45 Polycythemia vera: Secondary | ICD-10-CM | POA: Diagnosis present

## 2013-07-22 DIAGNOSIS — M79601 Pain in right arm: Secondary | ICD-10-CM | POA: Diagnosis present

## 2013-07-22 DIAGNOSIS — M109 Gout, unspecified: Secondary | ICD-10-CM | POA: Diagnosis present

## 2013-07-22 DIAGNOSIS — E785 Hyperlipidemia, unspecified: Secondary | ICD-10-CM | POA: Diagnosis present

## 2013-07-22 DIAGNOSIS — Z87891 Personal history of nicotine dependence: Secondary | ICD-10-CM

## 2013-07-22 DIAGNOSIS — I509 Heart failure, unspecified: Secondary | ICD-10-CM | POA: Diagnosis present

## 2013-07-22 DIAGNOSIS — E119 Type 2 diabetes mellitus without complications: Secondary | ICD-10-CM | POA: Diagnosis present

## 2013-07-22 DIAGNOSIS — I4729 Other ventricular tachycardia: Secondary | ICD-10-CM | POA: Diagnosis present

## 2013-07-22 DIAGNOSIS — E875 Hyperkalemia: Secondary | ICD-10-CM | POA: Diagnosis present

## 2013-07-22 DIAGNOSIS — I69922 Dysarthria following unspecified cerebrovascular disease: Secondary | ICD-10-CM

## 2013-07-22 DIAGNOSIS — I519 Heart disease, unspecified: Secondary | ICD-10-CM

## 2013-07-22 DIAGNOSIS — I69998 Other sequelae following unspecified cerebrovascular disease: Secondary | ICD-10-CM

## 2013-07-22 DIAGNOSIS — J449 Chronic obstructive pulmonary disease, unspecified: Secondary | ICD-10-CM | POA: Diagnosis present

## 2013-07-22 DIAGNOSIS — Z79899 Other long term (current) drug therapy: Secondary | ICD-10-CM

## 2013-07-22 DIAGNOSIS — Z833 Family history of diabetes mellitus: Secondary | ICD-10-CM

## 2013-07-22 DIAGNOSIS — Z7982 Long term (current) use of aspirin: Secondary | ICD-10-CM

## 2013-07-22 DIAGNOSIS — I5022 Chronic systolic (congestive) heart failure: Secondary | ICD-10-CM | POA: Diagnosis present

## 2013-07-22 DIAGNOSIS — M47812 Spondylosis without myelopathy or radiculopathy, cervical region: Principal | ICD-10-CM | POA: Diagnosis present

## 2013-07-22 HISTORY — DX: Cardiac arrhythmia, unspecified: I49.9

## 2013-07-22 HISTORY — DX: Hyperkalemia: E87.5

## 2013-07-22 LAB — URINALYSIS, ROUTINE W REFLEX MICROSCOPIC
Bilirubin Urine: NEGATIVE
Glucose, UA: NEGATIVE mg/dL
Hgb urine dipstick: NEGATIVE
Ketones, ur: NEGATIVE mg/dL
Leukocytes, UA: NEGATIVE
NITRITE: NEGATIVE
PH: 6 (ref 5.0–8.0)
Protein, ur: 30 mg/dL — AB
SPECIFIC GRAVITY, URINE: 1.008 (ref 1.005–1.030)
Urobilinogen, UA: 0.2 mg/dL (ref 0.0–1.0)

## 2013-07-22 LAB — POCT I-STAT, CHEM 8
BUN: 34 mg/dL — ABNORMAL HIGH (ref 6–23)
BUN: 20 mg/dL (ref 6–23)
CALCIUM ION: 1.15 mmol/L (ref 1.13–1.30)
CALCIUM ION: 1.29 mmol/L (ref 1.13–1.30)
CREATININE: 0.9 mg/dL (ref 0.50–1.35)
Chloride: 102 mEq/L (ref 96–112)
Chloride: 101 mEq/L (ref 96–112)
Creatinine, Ser: 1.1 mg/dL (ref 0.50–1.35)
Glucose, Bld: 87 mg/dL (ref 70–99)
Glucose, Bld: 154 mg/dL — ABNORMAL HIGH (ref 70–99)
HCT: 56 % — ABNORMAL HIGH (ref 39.0–52.0)
HEMATOCRIT: 59 % — AB (ref 39.0–52.0)
HEMOGLOBIN: 20.1 g/dL — AB (ref 13.0–17.0)
HEMOGLOBIN: 19 g/dL — AB (ref 13.0–17.0)
Potassium: 5.9 mEq/L — ABNORMAL HIGH (ref 3.7–5.3)
Potassium: 4 mEq/L (ref 3.7–5.3)
Sodium: 139 mEq/L (ref 137–147)
Sodium: 142 mEq/L (ref 137–147)
TCO2: 32 mmol/L (ref 0–100)
TCO2: 25 mmol/L (ref 0–100)

## 2013-07-22 LAB — URINE MICROSCOPIC-ADD ON

## 2013-07-22 LAB — CBC WITH DIFFERENTIAL/PLATELET
BASOS ABS: 0 10*3/uL (ref 0.0–0.1)
Basophils Relative: 0 % (ref 0–1)
EOS ABS: 0 10*3/uL (ref 0.0–0.7)
Eosinophils Relative: 1 % (ref 0–5)
HEMATOCRIT: 50.2 % (ref 39.0–52.0)
Hemoglobin: 17.5 g/dL — ABNORMAL HIGH (ref 13.0–17.0)
LYMPHS PCT: 36 % (ref 12–46)
Lymphs Abs: 1.3 10*3/uL (ref 0.7–4.0)
MCH: 29.8 pg (ref 26.0–34.0)
MCHC: 34.9 g/dL (ref 30.0–36.0)
MCV: 85.4 fL (ref 78.0–100.0)
MONO ABS: 0.5 10*3/uL (ref 0.1–1.0)
Monocytes Relative: 14 % — ABNORMAL HIGH (ref 3–12)
Neutro Abs: 1.9 10*3/uL (ref 1.7–7.7)
Neutrophils Relative %: 49 % (ref 43–77)
Platelets: 152 10*3/uL (ref 150–400)
RBC: 5.88 MIL/uL — ABNORMAL HIGH (ref 4.22–5.81)
RDW: 16.7 % — ABNORMAL HIGH (ref 11.5–15.5)
WBC: 3.8 10*3/uL — AB (ref 4.0–10.5)

## 2013-07-22 LAB — COMPREHENSIVE METABOLIC PANEL
ALT: 39 U/L (ref 0–53)
AST: 57 U/L — ABNORMAL HIGH (ref 0–37)
Albumin: 3.4 g/dL — ABNORMAL LOW (ref 3.5–5.2)
Alkaline Phosphatase: 65 U/L (ref 39–117)
BUN: 22 mg/dL (ref 6–23)
CO2: 27 meq/L (ref 19–32)
CREATININE: 0.96 mg/dL (ref 0.50–1.35)
Calcium: 9 mg/dL (ref 8.4–10.5)
Chloride: 103 mEq/L (ref 96–112)
GFR, EST NON AFRICAN AMERICAN: 80 mL/min — AB (ref >90)
Glucose, Bld: 94 mg/dL (ref 70–99)
Potassium: 6.5 mEq/L (ref 3.7–5.3)
Sodium: 142 mEq/L (ref 137–147)
TOTAL PROTEIN: 7.6 g/dL (ref 6.0–8.3)
Total Bilirubin: 0.6 mg/dL (ref 0.3–1.2)

## 2013-07-22 LAB — CREATININE, SERUM
CREATININE: 1 mg/dL (ref 0.50–1.35)
GFR calc Af Amer: 84 mL/min — ABNORMAL LOW (ref >90)
GFR calc non Af Amer: 73 mL/min — ABNORMAL LOW (ref >90)

## 2013-07-22 LAB — CBC
HCT: 52.7 % — ABNORMAL HIGH (ref 39.0–52.0)
Hemoglobin: 18 g/dL — ABNORMAL HIGH (ref 13.0–17.0)
MCH: 29.6 pg (ref 26.0–34.0)
MCHC: 34.2 g/dL (ref 30.0–36.0)
MCV: 86.5 fL (ref 78.0–100.0)
Platelets: 148 10*3/uL — ABNORMAL LOW (ref 150–400)
RBC: 6.09 MIL/uL — AB (ref 4.22–5.81)
RDW: 17.2 % — AB (ref 11.5–15.5)
WBC: 3.9 10*3/uL — ABNORMAL LOW (ref 4.0–10.5)

## 2013-07-22 LAB — POCT I-STAT TROPONIN I: TROPONIN I, POC: 0.04 ng/mL (ref 0.00–0.08)

## 2013-07-22 LAB — PRO B NATRIURETIC PEPTIDE: PRO B NATRI PEPTIDE: 8107 pg/mL — AB (ref 0–125)

## 2013-07-22 MED ORDER — CALCIUM GLUCONATE 10 % IV SOLN
1.0000 g | Freq: Once | INTRAVENOUS | Status: AC
  Administered 2013-07-22: 1 g via INTRAVENOUS
  Filled 2013-07-22: qty 10

## 2013-07-22 MED ORDER — HEPARIN SODIUM (PORCINE) 5000 UNIT/ML IJ SOLN
5000.0000 [IU] | Freq: Three times a day (TID) | INTRAMUSCULAR | Status: DC
  Administered 2013-07-22 – 2013-07-23 (×3): 5000 [IU] via SUBCUTANEOUS
  Filled 2013-07-22 (×5): qty 1

## 2013-07-22 MED ORDER — DEXTROSE 50 % IV SOLN
50.0000 mL | Freq: Once | INTRAVENOUS | Status: AC
  Administered 2013-07-22: 50 mL via INTRAVENOUS
  Filled 2013-07-22: qty 50

## 2013-07-22 MED ORDER — LISINOPRIL 40 MG PO TABS
40.0000 mg | ORAL_TABLET | Freq: Every day | ORAL | Status: DC
  Administered 2013-07-23: 40 mg via ORAL
  Filled 2013-07-22: qty 1

## 2013-07-22 MED ORDER — MORPHINE SULFATE 4 MG/ML IJ SOLN
4.0000 mg | Freq: Once | INTRAMUSCULAR | Status: AC
  Administered 2013-07-22: 4 mg via INTRAVENOUS
  Filled 2013-07-22: qty 1

## 2013-07-22 MED ORDER — FUROSEMIDE 10 MG/ML IJ SOLN
40.0000 mg | Freq: Once | INTRAMUSCULAR | Status: AC
  Administered 2013-07-22: 40 mg via INTRAVENOUS
  Filled 2013-07-22: qty 4

## 2013-07-22 MED ORDER — FUROSEMIDE 80 MG PO TABS
80.0000 mg | ORAL_TABLET | Freq: Two times a day (BID) | ORAL | Status: DC
  Administered 2013-07-23 (×2): 80 mg via ORAL
  Filled 2013-07-22 (×3): qty 1

## 2013-07-22 MED ORDER — FLUTICASONE PROPIONATE 50 MCG/ACT NA SUSP
2.0000 | Freq: Every day | NASAL | Status: DC
  Administered 2013-07-22: 2 via NASAL
  Filled 2013-07-22: qty 16

## 2013-07-22 MED ORDER — NITROGLYCERIN 0.4 MG SL SUBL: 0.4000 mg | SUBLINGUAL_TABLET | SUBLINGUAL | Status: DC | PRN

## 2013-07-22 MED ORDER — ACETAMINOPHEN 500 MG PO TABS
500.0000 mg | ORAL_TABLET | Freq: Four times a day (QID) | ORAL | Status: DC | PRN
  Administered 2013-07-23 (×2): 500 mg via ORAL
  Filled 2013-07-22 (×2): qty 1

## 2013-07-22 MED ORDER — CARVEDILOL 25 MG PO TABS
25.0000 mg | ORAL_TABLET | Freq: Two times a day (BID) | ORAL | Status: DC
  Administered 2013-07-23: 25 mg via ORAL
  Filled 2013-07-22 (×3): qty 1

## 2013-07-22 MED ORDER — FOSINOPRIL SODIUM 20 MG PO TABS: 40.0000 mg | ORAL_TABLET | Freq: Every day | ORAL | Status: DC

## 2013-07-22 MED ORDER — TRAMADOL HCL 50 MG PO TABS
50.0000 mg | ORAL_TABLET | Freq: Two times a day (BID) | ORAL | Status: DC | PRN
  Administered 2013-07-22: 50 mg via ORAL
  Filled 2013-07-22: qty 1

## 2013-07-22 MED ORDER — ASPIRIN EC 81 MG PO TBEC
81.0000 mg | DELAYED_RELEASE_TABLET | Freq: Every day | ORAL | Status: DC
  Administered 2013-07-23: 81 mg via ORAL
  Filled 2013-07-22: qty 1

## 2013-07-22 MED ORDER — ASPIRIN 81 MG PO TABS: 81.0000 mg | ORAL_TABLET | Freq: Every day | ORAL | Status: DC

## 2013-07-22 MED ORDER — SODIUM CHLORIDE 0.9 % IV SOLN
INTRAVENOUS | Status: DC
  Administered 2013-07-22: 17:00:00 via INTRAVENOUS

## 2013-07-22 MED ORDER — ATORVASTATIN CALCIUM 40 MG PO TABS
40.0000 mg | ORAL_TABLET | Freq: Every day | ORAL | Status: DC
  Administered 2013-07-23: 40 mg via ORAL
  Filled 2013-07-22: qty 1

## 2013-07-22 MED ORDER — GABAPENTIN 100 MG PO CAPS
100.0000 mg | ORAL_CAPSULE | Freq: Two times a day (BID) | ORAL | Status: DC
  Administered 2013-07-22 – 2013-07-23 (×2): 100 mg via ORAL
  Filled 2013-07-22 (×3): qty 1

## 2013-07-22 MED ORDER — SODIUM POLYSTYRENE SULFONATE 15 GM/60ML PO SUSP
30.0000 g | Freq: Once | ORAL | Status: AC
  Administered 2013-07-22: 30 g via ORAL
  Filled 2013-07-22: qty 120

## 2013-07-22 MED ORDER — COLCHICINE 0.6 MG PO TABS
0.6000 mg | ORAL_TABLET | Freq: Three times a day (TID) | ORAL | Status: DC | PRN
  Filled 2013-07-22: qty 1

## 2013-07-22 MED ORDER — INSULIN ASPART 100 UNIT/ML IV SOLN
5.0000 [IU] | Freq: Once | INTRAVENOUS | Status: AC
  Administered 2013-07-22: 5 [IU] via INTRAVENOUS
  Filled 2013-07-22 (×2): qty 0.05

## 2013-07-22 MED ORDER — SODIUM CHLORIDE 0.9 % IJ SOLN
3.0000 mL | Freq: Two times a day (BID) | INTRAMUSCULAR | Status: DC
  Administered 2013-07-22 – 2013-07-23 (×2): 3 mL via INTRAVENOUS

## 2013-07-22 NOTE — Progress Notes (Signed)
Report given to receiving RN. Patient is in bed resting with no verbal complaints and no signs or symptoms of distress or discomfort.  

## 2013-07-22 NOTE — ED Provider Notes (Signed)
CSN: [id number]     Arrival date & time 07/22/13  1122 History   First MD Initiated Contact with Patient 07/22/13 1146     Chief Complaint  Patient presents with  . Arm Pain  . Back Pain   (Consider location/radiation/quality/duration/timing/severity/associated sxs/prior Treatment) The history is provided by the patient.  [name]. is a 73 y.o. male hx of CHF, pneumonia here with shortness of breath, R arm pain. R arm pain for the last 2 weeks, worse yesterday. Intermittent shortness of breath as well. He had similar episode in December and lasix was increased and given potassium supplementation. Denies cough or fever. Legs chronically swollen, not worse than usual. He had fall several months ago and had radiculopathy in the past. No weakness or numbness.    Past Medical History  Diagnosis Date  . Pneumonia 11/01  . Cardiomyopathy     refused cath and refused icd/echo.Marland KitchenMarland Kitchen6/2009 10%  LV .Marland Kitchen.12mm+  . Volume overload   . H/O: drug dependency     In the past., clean for greater than 20 years  . Ejection fraction < 50%     EF 10%, echo, June, 2009, LV 75 mm  . Mitral regurgitation     Mild by history  . Right ventricular dysfunction     Mild to moderate, echo, June, 2009  . Tobacco abuse   . Systolic CHF, chronic     Patient is refusing ICD over the years  . Substance abuse   . Stroke   . Polycythemia     Jak 2 Negative   Past Surgical History  Procedure Laterality Date  . Colonoscopy    . Ingunial hernia repair x2     Family History  Problem Relation Age of Onset  . Diabetes Mother   . Colon cancer Neg Hx   . Esophageal cancer Neg Hx   . Stomach cancer Neg Hx   . Rectal cancer Neg Hx    History  Substance Use Topics  . Smoking status: Former Smoker    Types: Cigarettes    Quit date: 06/17/2010  . Smokeless tobacco: Never Used  . Alcohol Use: 1.5 oz/week    3 drink(s) per week    Review of Systems  Respiratory: Positive for shortness of breath.    Musculoskeletal: Positive for back pain.       Arm pain   All other systems reviewed and are negative.    Allergies  Review of patient's allergies indicates no known allergies.  Home Medications   Current Outpatient Rx  Name  Route  Sig  Dispense  Refill  . acetaminophen (TYLENOL) 500 MG tablet   Oral   Take 500 mg by mouth every 6 (six) hours as needed for mild pain or headache.          Marland Kitchen aspirin 81 MG tablet   Oral   Take 81 mg by mouth daily.           . carvedilol (COREG) 25 MG tablet   Oral   Take 25 mg by mouth 2 (two) times daily with a meal.         . colchicine 0.6 MG tablet   Oral   Take 0.6 mg by mouth every 8 (eight) hours as needed (for gout flares).         . fluticasone (FLONASE) 50 MCG/ACT nasal spray   Nasal   Place 2 sprays into the nose at bedtime.          Marland Kitchen  fosinopril (MONOPRIL) 40 MG tablet   Oral   Take 40 mg by mouth daily.         . furosemide (LASIX) 80 MG tablet   Oral   Take 1 tablet (80 mg total) by mouth 2 (two) times daily.   60 tablet   11   . nitroGLYCERIN (NITROSTAT) 0.4 MG SL tablet   Sublingual   Place 1 tablet (0.4 mg total) under the tongue every 5 (five) minutes as needed for chest pain.   25 tablet   3   . potassium chloride SA (K-DUR,KLOR-CON) 20 MEQ tablet   Oral   Take 1 tablet (20 mEq total) by mouth 2 (two) times daily.   60 tablet   11   . rosuvastatin (CRESTOR) 40 MG tablet   Oral   Take 40 mg by mouth daily.           BP 117/82  Pulse 59  Temp(Src) 97.6 F (36.4 C) (Axillary)  Resp 15  Ht 5\' 11"  (1.803 m)  SpO2 95% Physical Exam  Nursing note and vitals reviewed. Constitutional: He is oriented to person, place, and time.  Uncomfortable   HENT:  Head: Normocephalic.  Mouth/Throat: Oropharynx is clear and moist.  Eyes: Conjunctivae are normal. Pupils are equal, round, and reactive to light.  Neck: Normal range of motion. Neck supple.  Cardiovascular: Normal rate, regular rhythm  and normal heart sounds.   Pulmonary/Chest: Effort normal. No respiratory distress. He has no wheezes.  Diminished bilateral bases   Abdominal: Soft. Bowel sounds are normal. He exhibits no distension. There is no tenderness. There is no rebound.  Musculoskeletal: Normal range of motion.  1+ edema bilaterally. Nl strength and sensation throughout   Neurological: He is alert and oriented to person, place, and time. No cranial nerve deficit. Coordination normal.  Skin: Skin is warm and dry.  Psychiatric: He has a normal mood and affect. His behavior is normal. Judgment and thought content normal.    ED Course  Procedures (including critical care time) Labs Review Labs Reviewed  CBC WITH DIFFERENTIAL - Abnormal; Notable for the following:    WBC 3.8 (*)    RBC 5.88 (*)    Hemoglobin 17.5 (*)    RDW 16.7 (*)    Monocytes Relative 14 (*)    All other components within normal limits  COMPREHENSIVE METABOLIC PANEL - Abnormal; Notable for the following:    Potassium 6.5 (*)    Albumin 3.4 (*)    AST 57 (*)    GFR calc non Af Amer 80 (*)    All other components within normal limits  PRO B NATRIURETIC PEPTIDE - Abnormal; Notable for the following:    Pro B Natriuretic peptide (BNP) 8107.0 (*)    All other components within normal limits  POCT I-STAT, CHEM 8 - Abnormal; Notable for the following:    Potassium 5.9 (*)    BUN 34 (*)    Hemoglobin 20.1 (*)    HCT 59.0 (*)    All other components within normal limits  URINALYSIS, ROUTINE W REFLEX MICROSCOPIC  POCT I-STAT TROPONIN I   Imaging Review Dg Chest 2 View  07/22/2013   CLINICAL DATA:  73 year old male altered mental status and shortness of Breath. Initial encounter.  EXAM: CHEST  2 VIEW  COMPARISON:  06/18/2013 and earlier.  FINDINGS: Stable lung volumes. Stable cardiomegaly and mediastinal contours. Stable retrocardiac hypoventilation. No pneumothorax, pulmonary edema or pleural effusion. No new pulmonary opacity. Stable  visualized osseous structures.  IMPRESSION: Stable cardiomegaly. No acute cardiopulmonary abnormality.   Electronically Signed   By: Lars Pinks M.D.   On: 07/22/2013 13:17    EKG Interpretation    Date/Time:  Thursday July 22 2013 11:37:24 EST Ventricular Rate:  52 PR Interval:  224 QRS Duration: 130 QT Interval:  546 QTC Calculation: 507 R Axis:   -66 Text Interpretation:  Sinus bradycardia with 1st degree A-V block Left axis deviation Non-specific intra-ventricular conduction block Cannot rule out Anterior infarct , age undetermined T wave abnormality, consider lateral ischemia Abnormal ECG No significant change since last tracing Confirmed by Aslyn Cottman  MD, Linn Clavin [phone]) on 07/22/2013 12:06:23 PM            MDM  No diagnosis found. Matheau Orona. is a 73 y.o. male here with SOB, chest pain, R arm pain. Consider CHF vs ACS. R arm likely radiculopathy, less likely ACS. Will get BNP, labs, trop.   2 PM K 6.5, repeated was 5.9. No EKG changes. BNP 8000, worse than previous, but CXR stable so will give lasix. I ordered Kayexelate, insulin, glucose, ca gluconate. I called Dr. Pasty Arch from [facility], will admit but request repeat K prior to getting a bed.   3:41 PM K now normal. Will admit to tele to continue to monitor K and for CHF.   Wandra Arthurs, MD 07/22/13 [phone]

## 2013-07-22 NOTE — ED Notes (Signed)
Pt reports being here for upper back pain that radiates down right arm x 2 weeks. Family member states pt lives alone and pt is not acting his normal self x 2 weeks. Temp was 94.0 at triage. Airway intact.

## 2013-07-22 NOTE — H&P (Signed)
[name]. is an 73 y.o. male.   PCP:   Janalyn Rouse, MD   Chief Complaint:  Hyperkalemia Arm Pain  Back Pain    HPI: 73 y.o. male hx of CHF, CM c EF 10%, problems c hyperkalemia before and has been off aldactone, and multiple other issues.  He presented to ED c with shortness of breath, R arm pain. R arm pain for the last 2 weeks. R shoulder pain, R arm pains, radiculopathy to fingers.  Mild R axillary pain. The pains started to get much worse yesterday. Tylenol did not help.  Intermittent shortness of breath as well. He had similar episode in December and lasix was increased and given potassium supplementation.  Denies cough or fever. Legs chronically swollen, nottoday. He had fallen several months ago and had radiculopathy in the past.  Describes some RUE weakness and numbness. The ED was concerned @ angina vrs CHF vrs? W/up revealed hyperkalemia and he was Rx with c Kayexelate,  Glucose/Insulin, ca gluconate K  Normalized and we admitted to tele to continue to monitor K and for CHF He received some IVF's No other c/o's    Past Medical History:  Past Medical History  Diagnosis Date  . Pneumonia 11/01  . Cardiomyopathy     refused cath and refused icd/echo.Marland KitchenMarland Kitchen6/2009 10%  LV .Marland Kitchen.15mm+  . Volume overload   . H/O: drug dependency     In the past., clean for greater than 20 years  . Ejection fraction < 50%     EF 10%, echo, June, 2009, LV 75 mm  . Mitral regurgitation     Mild by history  . Right ventricular dysfunction     Mild to moderate, echo, June, 2009  . Tobacco abuse   . Systolic CHF, chronic     Patient is refusing ICD over the years  . Substance abuse   . Stroke   . Polycythemia     Jak 2 Negative     COPD CHF- nonischemic LM with Mod. RV failure; EF 10% (7/06); 10% (6/09) DM2- diet-controlled +microalbumenuria Hyperlipidemia CVA-->(L) vision loss, dysarthria (1999) Polycythemia (erythrocytosis), secondary Nicotine Addiction Allergic rhinitis (L)  pleural effusion Colon polyps, adenomatous (3/13)  S/P (L) inguinal hernia repair     Past Surgical History  Procedure Laterality Date  . Colonoscopy    . Ingunial hernia repair x2        Allergies:  No Known Allergies   Medications: Prior to Admission medications   Medication Sig Start Date End Date Taking? Authorizing Provider  acetaminophen (TYLENOL) 500 MG tablet Take 500 mg by mouth every 6 (six) hours as needed for mild pain or headache.    Yes Historical Provider, MD  aspirin 81 MG tablet Take 81 mg by mouth daily.     Yes Historical Provider, MD  carvedilol (COREG) 25 MG tablet Take 25 mg by mouth 2 (two) times daily with a meal.   Yes Historical Provider, MD  colchicine 0.6 MG tablet Take 0.6 mg by mouth every 8 (eight) hours as needed (for gout flares).   Yes Historical Provider, MD  fluticasone (FLONASE) 50 MCG/ACT nasal spray Place 2 sprays into the nose at bedtime.  05/25/13  Yes Historical Provider, MD  fosinopril (MONOPRIL) 40 MG tablet Take 40 mg by mouth daily.   Yes Historical Provider, MD  furosemide (LASIX) 80 MG tablet Take 1 tablet (80 mg total) by mouth 2 (two) times daily. 06/18/13  Yes Liliane Shi, PA-C  nitroGLYCERIN (NITROSTAT)  0.4 MG SL tablet Place 1 tablet (0.4 mg total) under the tongue every 5 (five) minutes as needed for chest pain. 06/18/13  Yes Scott T Kathlen Mody, PA-C  potassium chloride SA (K-DUR,KLOR-CON) 20 MEQ tablet Take 1 tablet (20 mEq total) by mouth 2 (two) times daily. 06/18/13  Yes Scott Joylene Draft, PA-C  rosuvastatin (CRESTOR) 40 MG tablet Take 40 mg by mouth daily.    Yes Historical Provider, MD     Medications Prior to Admission  Medication Dose Route Frequency Provider Last Rate Last Dose  . 0.9 %  sodium chloride infusion  500 mL Intravenous Continuous Irene Shipper, MD       Medications Prior to Admission  Medication Sig Dispense Refill  . acetaminophen (TYLENOL) 500 MG tablet Take 500 mg by mouth every 6 (six) hours as needed for  mild pain or headache.       Marland Kitchen aspirin 81 MG tablet Take 81 mg by mouth daily.        . carvedilol (COREG) 25 MG tablet Take 25 mg by mouth 2 (two) times daily with a meal.      . colchicine 0.6 MG tablet Take 0.6 mg by mouth every 8 (eight) hours as needed (for gout flares).      . fluticasone (FLONASE) 50 MCG/ACT nasal spray Place 2 sprays into the nose at bedtime.       . fosinopril (MONOPRIL) 40 MG tablet Take 40 mg by mouth daily.      . furosemide (LASIX) 80 MG tablet Take 1 tablet (80 mg total) by mouth 2 (two) times daily.  60 tablet  11  . nitroGLYCERIN (NITROSTAT) 0.4 MG SL tablet Place 1 tablet (0.4 mg total) under the tongue every 5 (five) minutes as needed for chest pain.  25 tablet  3  . potassium chloride SA (K-DUR,KLOR-CON) 20 MEQ tablet Take 1 tablet (20 mEq total) by mouth 2 (two) times daily.  60 tablet  11  . rosuvastatin (CRESTOR) 40 MG tablet Take 40 mg by mouth daily.          Social History:  reports that he quit smoking about 3 years ago. His smoking use included Cigarettes. He smoked 0.00 packs per day. He has never used smokeless tobacco. He reports that he drinks about 1.5 ounces of alcohol per week. He reports that he does not use illicit drugs.  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 Diabetes Mother   . Colon cancer Neg Hx   . Esophageal cancer Neg Hx   . Stomach cancer Neg Hx   . Rectal cancer Neg Hx     Family History (reviewed - no changes required): Father: ALS Mother: DM2 Grandparents: ? Brother-CAD s/p CABG, colon cancer, d81. Brother-CAD s/p CABG, brain aneurysm (died 81).  Sister- died Rheumatic Fever. Sister-deceased cancer ?Marland Kitchen Sister- died COPD  Review of Systems:  Review  of Systems - Full ROS done. See HPI. No CP His biggest issue is neck/Shoulder and arm pains c weakness and numbness   Physical Exam:  Blood pressure 118/80, pulse 62, temperature 97.6 F (36.4 C), temperature source Axillary, resp. rate 20, height 5\' 11"  (1.803 m), weight 71.079 kg (156 lb 11.2 oz), SpO2 99.00%. Filed  Vitals:   07/22/13 1330 07/22/13 1430 07/22/13 1600 07/22/13 1639  BP: 113/70 121/88 111/80 118/80  Pulse: 50 63 60 62  Temp:      TempSrc:      Resp:    20  Height:    5\' 11"  (1.803 m)  Weight:    71.079 kg (156 lb 11.2 oz)  SpO2: 96% 95% 95% 99%   General appearance: Alert and O Head: Normocephalic, without obvious abnormality, atraumatic Eyes: conjunctivae/corneas clear. PERRL, EOM's intact.  Nose: Nares normal. Septum midline. Mucosa normal. No drainage or sinus tenderness. Throat: lips, mucosa, and tongue normal; teeth and gums normal Neck: no adenopathy, no carotid bruit, no JVD and thyroid not enlarged, symmetric, no tenderness/mass/nodules Resp: CTA B Cardio: Reg c m GI: thin soft, non-tender; bowel sounds normal; no masses,  no organomegaly Extremities: extremities normal, atraumatic, no cyanosis or edema Pulses: 2+ and symmetric Lymph nodes:  no cervical lymphadenopathy Neurologic: Alert and oriented X 3, normal strength and tone. Normal symmetric reflexes.  No neck tenderness.  Mild decrease ROM.  Good Shoulder ROM.  Grip is fine.  No loss of pinprick.  No obvious findings.    Labs on Admission:   Recent Labs  07/22/13 1205 07/22/13 1318 07/22/13 1525  NA 142 139 142  K 6.5* 5.9* 4.0  CL 103 102 101  CO2 27  --   --   GLUCOSE 94 87 154*  BUN 22 34* 20  CREATININE 0.96 1.10 0.90  CALCIUM 9.0  --   --     Recent Labs  07/22/13 1205  AST 57*  ALT 39  ALKPHOS 65  BILITOT 0.6  PROT 7.6  ALBUMIN 3.4*   No results found for this basename: LIPASE, AMYLASE,  in the last 72 hours  Recent Labs  07/22/13 1205 07/22/13 1318  07/22/13 1525  WBC 3.8*  --   --   NEUTROABS 1.9  --   --   HGB 17.5* 20.1* 19.0*  HCT 50.2 59.0* 56.0*  MCV 85.4  --   --   PLT 152  --   --    No results found for this basename: CKTOTAL, CKMB, CKMBINDEX, TROPONINI,  in the last 72 hours No results found for this basename: INR,  PROTIME     LAB RESULT POCT:  Results for orders placed during the hospital encounter of 07/22/13  CBC WITH DIFFERENTIAL      Result Value Range   WBC 3.8 (*) 4.0 - 10.5 K/uL   RBC 5.88 (*) 4.22 - 5.81 MIL/uL   Hemoglobin 17.5 (*) 13.0 - 17.0 g/dL   HCT 50.2  39.0 - 52.0 %   MCV 85.4  78.0 - 100.0 fL   MCH 29.8  26.0 - 34.0 pg   MCHC 34.9  30.0 - 36.0 g/dL   RDW 16.7 (*) 11.5 - 15.5 %   Platelets 152  150 - 400 K/uL   Neutrophils Relative % 49  43 - 77 %   Neutro Abs 1.9  1.7 - 7.7 K/uL   Lymphocytes Relative 36  12 - 46 %   Lymphs Abs 1.3  0.7 - 4.0 K/uL   Monocytes Relative 14 (*) 3 - 12 %   Monocytes Absolute 0.5  0.1 - 1.0 K/uL   Eosinophils Relative 1  0 - 5 %   Eosinophils Absolute 0.0  0.0 - 0.7 K/uL   Basophils Relative 0  0 - 1 %   Basophils Absolute 0.0  0.0 -  0.1 K/uL  COMPREHENSIVE METABOLIC PANEL      Result Value Range   Sodium 142  137 - 147 mEq/L   Potassium 6.5 (*) 3.7 - 5.3 mEq/L   Chloride 103  96 - 112 mEq/L   CO2 27  19 - 32 mEq/L   Glucose, Bld 94  70 - 99 mg/dL   BUN 22  6 - 23 mg/dL   Creatinine, Ser 0.96  0.50 - 1.35 mg/dL   Calcium 9.0  8.4 - 10.5 mg/dL   Total Protein 7.6  6.0 - 8.3 g/dL   Albumin 3.4 (*) 3.5 - 5.2 g/dL   AST 57 (*) 0 - 37 U/L   ALT 39  0 - 53 U/L   Alkaline Phosphatase 65  39 - 117 U/L   Total Bilirubin 0.6  0.3 - 1.2 mg/dL   GFR calc non Af Amer 80 (*) >90 mL/min   GFR calc Af Amer >90  >90 mL/min  URINALYSIS, ROUTINE W REFLEX MICROSCOPIC      Result Value Range   Color, Urine YELLOW  YELLOW   APPearance CLEAR  CLEAR   Specific Gravity, Urine 1.008  1.005 - 1.030   pH 6.0  5.0 - 8.0   Glucose, UA NEGATIVE  NEGATIVE mg/dL   Hgb urine  dipstick NEGATIVE  NEGATIVE   Bilirubin Urine NEGATIVE  NEGATIVE   Ketones, ur NEGATIVE  NEGATIVE mg/dL   Protein, ur 30 (*) NEGATIVE mg/dL   Urobilinogen, UA 0.2  0.0 - 1.0 mg/dL   Nitrite NEGATIVE  NEGATIVE   Leukocytes, UA NEGATIVE  NEGATIVE  PRO B NATRIURETIC PEPTIDE      Result Value Range   Pro B Natriuretic peptide (BNP) 8107.0 (*) 0 - 125 pg/mL  URINE MICROSCOPIC-ADD ON      Result Value Range   Squamous Epithelial / LPF RARE  RARE   RBC / HPF 0-2  <3 RBC/hpf  POCT I-STAT TROPONIN I      Result Value Range   Troponin i, poc 0.04  0.00 - 0.08 ng/mL   Comment 3           POCT I-STAT, CHEM 8      Result Value Range   Sodium 139  137 - 147 mEq/L   Potassium 5.9 (*) 3.7 - 5.3 mEq/L   Chloride 102  96 - 112 mEq/L   BUN 34 (*) 6 - 23 mg/dL   Creatinine, Ser 1.10  0.50 - 1.35 mg/dL   Glucose, Bld 87  70 - 99 mg/dL   Calcium, Ion 1.15  1.13 - 1.30 mmol/L   TCO2 32  0 - 100 mmol/L   Hemoglobin 20.1 (*) 13.0 - 17.0 g/dL   HCT 59.0 (*) 39.0 - 52.0 %  POCT I-STAT, CHEM 8      Result Value Range   Sodium 142  137 - 147 mEq/L   Potassium 4.0  3.7 - 5.3 mEq/L   Chloride 101  96 - 112 mEq/L   BUN 20  6 - 23 mg/dL   Creatinine, Ser 0.90  0.50 - 1.35 mg/dL   Glucose, Bld 154 (*) 70 - 99 mg/dL   Calcium, Ion 1.29  1.13 - 1.30 mmol/L   TCO2 25  0 - 100 mmol/L   Hemoglobin 19.0 (*) 13.0 - 17.0 g/dL   HCT 56.0 (*) 39.0 - 52.0 %      Radiological Exams on Admission: Dg Chest 2 View  07/22/2013   CLINICAL DATA:  73 year old  male altered mental status and shortness of Breath. Initial encounter.  EXAM: CHEST  2 VIEW  COMPARISON:  06/18/2013 and earlier.  FINDINGS: Stable lung volumes. Stable cardiomegaly and mediastinal contours. Stable retrocardiac hypoventilation. No pneumothorax, pulmonary edema or pleural effusion. No new pulmonary opacity. Stable visualized osseous structures.  IMPRESSION: Stable cardiomegaly. No acute cardiopulmonary abnormality.   Electronically Signed   By: Lars Pinks M.D.   On: 07/22/2013 13:17      Orders placed during the hospital encounter of 07/22/13  . EKG 12-LEAD  . EKG 12-LEAD     Assessment/Plan Active Problems:   Hyperkalemia   Hyperkalemia - S/P Rx in ED. K now normal. Will admit to tele to continue to monitor K and for CHF Follow on Tele. Recheck EKG in am. Hold K supplementation. Cr is 0.9 - NTD Watch K on the ACEi.  If K is fine in am and no CV issues appreciated this may be a short admission  Known CHF- nonischemic CM with Mod. RV failure; EF 10% - medical therapy with beta blocker, ACE inhibitor, diuretics. Current pro - BNP is 8107.  CXR = Stable cardiomegaly. No acute cardiopulmonary abnormality.  He is Euvolemic on exam. [name] PA saw pt 06/18/13 and moved lasix to 80 BID and added Kdur 20 mEq BID.  Potassium stopped.  He has refused ICD and cardiac Cath..  neck/Shoulder and arm pains c weakness and numbness - not c/w ACS or cardiac etiology.  Seems Neuropathic - Check Neck and R Shoulder x ray  - may need MRI.  Consider short course steroids.  Pain meds and low dose neurontin on order.  Trop I was (-)  Gout - Colcrys prn  Lipids - crestor changed to lipitor based on hospital formulary  DVT Proph - SQ heparin  COPD - Breathing Well.  Needs to quit tob for good.  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  No issues.  Follow cbc in am  Jolon Degante M 07/22/2013, 6:21 PM

## 2013-07-22 NOTE — ED Notes (Signed)
CRITICAL VALUE ALERT  Critical value received:  K 6.5   Date of notification:  07/22/2013  Time of notification:  1252  Critical value read back: Yes   Nurse who received alert:  Elyn Peers   MD notified (1st page):  Md Darl Householder

## 2013-07-23 ENCOUNTER — Encounter (HOSPITAL_COMMUNITY): Payer: Self-pay | Admitting: General Practice

## 2013-07-23 ENCOUNTER — Other Ambulatory Visit: Payer: Self-pay | Admitting: Internal Medicine

## 2013-07-23 ENCOUNTER — Other Ambulatory Visit: Payer: Self-pay

## 2013-07-23 DIAGNOSIS — I5022 Chronic systolic (congestive) heart failure: Secondary | ICD-10-CM

## 2013-07-23 DIAGNOSIS — I4729 Other ventricular tachycardia: Secondary | ICD-10-CM | POA: Diagnosis present

## 2013-07-23 DIAGNOSIS — I428 Other cardiomyopathies: Secondary | ICD-10-CM

## 2013-07-23 DIAGNOSIS — I472 Ventricular tachycardia: Secondary | ICD-10-CM | POA: Diagnosis present

## 2013-07-23 DIAGNOSIS — I369 Nonrheumatic tricuspid valve disorder, unspecified: Secondary | ICD-10-CM

## 2013-07-23 DIAGNOSIS — M79601 Pain in right arm: Secondary | ICD-10-CM | POA: Diagnosis present

## 2013-07-23 DIAGNOSIS — I509 Heart failure, unspecified: Secondary | ICD-10-CM

## 2013-07-23 DIAGNOSIS — I519 Heart disease, unspecified: Secondary | ICD-10-CM

## 2013-07-23 DIAGNOSIS — I429 Cardiomyopathy, unspecified: Secondary | ICD-10-CM

## 2013-07-23 LAB — CBC
HCT: 48.4 % (ref 39.0–52.0)
Hemoglobin: 16.5 g/dL (ref 13.0–17.0)
MCH: 29.2 pg (ref 26.0–34.0)
MCHC: 34.1 g/dL (ref 30.0–36.0)
MCV: 85.5 fL (ref 78.0–100.0)
PLATELETS: 157 10*3/uL (ref 150–400)
RBC: 5.66 MIL/uL (ref 4.22–5.81)
RDW: 16.8 % — ABNORMAL HIGH (ref 11.5–15.5)
WBC: 4.7 10*3/uL (ref 4.0–10.5)

## 2013-07-23 LAB — BASIC METABOLIC PANEL
BUN: 17 mg/dL (ref 6–23)
CALCIUM: 9 mg/dL (ref 8.4–10.5)
CO2: 28 mEq/L (ref 19–32)
CREATININE: 0.98 mg/dL (ref 0.50–1.35)
Chloride: 100 mEq/L (ref 96–112)
GFR calc Af Amer: >90 mL/min (ref >90)
GFR, EST NON AFRICAN AMERICAN: 80 mL/min — AB (ref >90)
GLUCOSE: 83 mg/dL (ref 70–99)
Potassium: 4.1 mEq/L (ref 3.7–5.3)
Sodium: 140 mEq/L (ref 137–147)

## 2013-07-23 LAB — PHOSPHORUS: Phosphorus: 3.6 mg/dL (ref 2.3–4.6)

## 2013-07-23 LAB — POTASSIUM: Potassium: 4 mEq/L (ref 3.7–5.3)

## 2013-07-23 LAB — MAGNESIUM: MAGNESIUM: 2.1 mg/dL (ref 1.5–2.5)

## 2013-07-23 MED ORDER — TRAMADOL HCL 50 MG PO TABS: 50.0000 mg | ORAL_TABLET | Freq: Three times a day (TID) | ORAL | Status: DC | PRN

## 2013-07-23 MED ORDER — GABAPENTIN 100 MG PO CAPS: 100.0000 mg | ORAL_CAPSULE | Freq: Two times a day (BID) | ORAL | Status: DC

## 2013-07-23 NOTE — Progress Notes (Signed)
Pt had 5 beat run of V-tach, no S/S, MD notified, will continue to monitor, Thanks Arvella Nigh RN

## 2013-07-23 NOTE — Progress Notes (Signed)
Utilization review completed. Dominga Mcduffie, RN, BSN. 

## 2013-07-23 NOTE — Progress Notes (Signed)
IV d/c'd.  Tele d/c'd.  Pt d/c'd to home.  Home meds and d/c instructions have been discussed with pt.  Pt denies any questions or concerns at this time.  Pt leaving unit via wheelchair and appears in no acute distress. Adalbert Alberto RN 

## 2013-07-23 NOTE — Progress Notes (Signed)
Pt had 10 beat run of V-tach , no s/s; BP 105/80, HR 65, MD notified, will continue to monitor, Arvella Nigh RN

## 2013-07-23 NOTE — Discharge Instructions (Signed)
Heart Failure °Heart failure is a condition in which the heart has trouble pumping blood. This means your heart does not pump blood efficiently for your body to work well. In some cases of heart failure, fluid may back up into your lungs or you may have swelling (edema) in your lower legs. Heart failure is usually a long-term (chronic) condition. It is important for you to take good care of yourself and follow your caregiver's treatment plan. °CAUSES  °Some health conditions can cause heart failure. Those health conditions include: °· High blood pressure (hypertension) causes the heart muscle to work harder than normal. When pressure in the blood vessels is high, the heart needs to pump (contract) with more force in order to circulate blood throughout the body. High blood pressure eventually causes the heart to become stiff and weak. °· Coronary artery disease (CAD) is the buildup of cholesterol and fat (plaque) in the arteries of the heart. The blockage in the arteries deprives the heart muscle of oxygen and blood. This can cause chest pain and may lead to a heart attack. High blood pressure can also contribute to CAD. °· Heart attack (myocardial infarction) occurs when 1 or more arteries in the heart become blocked. The loss of oxygen damages the muscle tissue of the heart. When this happens, part of the heart muscle dies. The injured tissue does not contract as well and weakens the heart's ability to pump blood. °· Abnormal heart valves can cause heart failure when the heart valves do not open and close properly. This makes the heart muscle pump harder to keep the blood flowing. °· Heart muscle disease (cardiomyopathy or myocarditis) is damage to the heart muscle from a variety of causes. These can include drug or alcohol abuse, infections, or unknown reasons. These can increase the risk of heart failure. °· Lung disease makes the heart work harder because the lungs do not work properly. This can cause a strain  on the heart, leading it to fail. °· Diabetes increases the risk of heart failure. High blood sugar contributes to high fat (lipid) levels in the blood. Diabetes can also cause slow damage to tiny blood vessels that carry important nutrients to the heart muscle. When the heart does not get enough oxygen and food, it can cause the heart to become weak and stiff. This leads to a heart that does not contract efficiently. °· Other conditions can contribute to heart failure. These include abnormal heart rhythms, thyroid problems, and low blood counts (anemia). °Certain unhealthy behaviors can increase the risk of heart failure. Those unhealthy behaviors include: °· Being overweight. °· Smoking or chewing tobacco. °· Eating foods high in fat and cholesterol. °· Abusing illicit drugs or alcohol. °· Lacking physical activity. °SYMPTOMS  °Heart failure symptoms may vary and can be hard to detect. Symptoms may include: °· Shortness of breath with activity, such as climbing stairs. °· Persistent cough. °· Swelling of the feet, ankles, legs, or abdomen. °· Unexplained weight gain. °· Difficulty breathing when lying flat (orthopnea). °· Waking from sleep because of the need to sit up and get more air. °· Rapid heartbeat. °· Fatigue and loss of energy. °· Feeling lightheaded, dizzy, or close to fainting. °· Loss of appetite. °· Nausea. °· Increased urination during the night (nocturia). °DIAGNOSIS  °A diagnosis of heart failure is based on your history, symptoms, physical examination, and diagnostic tests. °Diagnostic tests for heart failure may include: °· Echocardiography. °· Electrocardiography. °· Chest X-ray. °· Blood tests. °· Exercise   stress test. °· Cardiac angiography. °· Radionuclide scans. °TREATMENT  °Treatment is aimed at managing the symptoms of heart failure. Medicines, behavioral changes, or surgical intervention may be necessary to treat heart failure. °· Medicines to help treat heart failure may  include: °· Angiotensin-converting enzyme (ACE) inhibitors. This type of medicine blocks the effects of a blood protein called angiotensin-converting enzyme. ACE inhibitors relax (dilate) the blood vessels and help lower blood pressure. °· Angiotensin receptor blockers. This type of medicine blocks the actions of a blood protein called angiotensin. Angiotensin receptor blockers dilate the blood vessels and help lower blood pressure. °· Water pills (diuretics). Diuretics cause the kidneys to remove salt and water from the blood. The extra fluid is removed through urination. This loss of extra fluid lowers the volume of blood the heart pumps. °· Beta blockers. These prevent the heart from beating too fast and improve heart muscle strength. °· Digitalis. This increases the force of the heartbeat. °· Healthy behavior changes include: °· Obtaining and maintaining a healthy weight. °· Stopping smoking or chewing tobacco. °· Eating heart healthy foods. °· Limiting or avoiding alcohol. °· Stopping illicit drug use. °· Physical activity as directed by your caregiver. °· Surgical treatment for heart failure may include: °· A procedure to open blocked arteries, repair damaged heart valves, or remove damaged heart muscle tissue. °· A pacemaker to improve heart muscle function and control certain abnormal heart rhythms. °· An internal cardioverter defibrillator to treat certain serious abnormal heart rhythms. °· A left ventricular assist device to assist the pumping ability of the heart. °HOME CARE INSTRUCTIONS  °· Take your medicine as directed by your caregiver. Medicines are important in reducing the workload of your heart, slowing the progression of heart failure, and improving your symptoms. °· Do not stop taking your medicine unless directed by your caregiver. °· Do not skip any dose of medicine. °· Refill your prescriptions before you run out of medicine. Your medicines are needed every day. °· Take over-the-counter  medicine only as directed by your caregiver or pharmacist. °· Engage in moderate physical activity if directed by your caregiver. Moderate physical activity can benefit some people. The elderly and people with severe heart failure should consult with a caregiver for physical activity recommendations. °· Eat heart healthy foods. Food choices should be free of trans fat and low in saturated fat, cholesterol, and salt (sodium). Healthy choices include fresh or frozen fruits and vegetables, fish, lean meats, legumes, fat-free or low-fat dairy products, and whole grain or high fiber foods. Talk to a dietitian to learn more about heart healthy foods. °· Limit sodium if directed by your caregiver. Sodium restriction may reduce symptoms of heart failure in some people. Talk to a dietitian to learn more about heart healthy seasonings. °· Use healthy cooking methods. Healthy cooking methods include roasting, grilling, broiling, baking, poaching, steaming, or stir-frying. Talk to a dietitian to learn more about healthy cooking methods. °· Limit fluids if directed by your caregiver. Fluid restriction may reduce symptoms of heart failure in some people. °· Weigh yourself every day. Daily weights are important in the early recognition of excess fluid. You should weigh yourself every morning after you urinate and before you eat breakfast. Wear the same amount of clothing each time you weigh yourself. Record your daily weight. Provide your caregiver with your weight record. °· Monitor and record your blood pressure if directed by your caregiver. °· Check your pulse if directed by your caregiver. °· Lose weight if directed   by your caregiver. Weight loss may reduce symptoms of heart failure in some people. °· Stop smoking or chewing tobacco. Nicotine makes your heart work harder by causing your blood vessels to constrict. Do not use nicotine gum or patches before talking to your caregiver. °· Schedule and attend follow-up visits as  directed by your caregiver. It is important to keep all your appointments. °· Limit alcohol intake to no more than 1 drink per day for nonpregnant women and 2 drinks per day for men. Drinking more than that is harmful to your heart. Tell your caregiver if you drink alcohol several times a week. Talk with your caregiver about whether alcohol is safe for you. If your heart has already been damaged by alcohol or you have severe heart failure, drinking alcohol should be stopped completely. °· Stop illicit drug use. °· Stay up-to-date with immunizations. It is especially important to prevent respiratory infections through current pneumococcal and influenza immunizations. °· Manage other health conditions such as hypertension, diabetes, thyroid disease, or abnormal heart rhythms as directed by your caregiver. °· Learn to manage stress. °· Plan rest periods when fatigued. °· Learn strategies to manage high temperatures. If the weather is extremely hot: °· Avoid vigorous physical activity. °· Use air conditioning or fans or seek a cooler location. °· Avoid caffeine and alcohol. °· Wear loose-fitting, lightweight, and light-colored clothing. °· Learn strategies to manage cold temperatures. If the weather is extremely cold: °· Avoid vigorous physical activity. °· Layer clothes. °· Wear mittens or gloves, a hat, and a scarf when going outside. °· Avoid alcohol. °· Obtain ongoing education and support as needed. °· Participate or seek rehabilitation as needed to maintain or improve independence and quality of life. °SEEK MEDICAL CARE IF:  °· Your weight increases by 03 lb/1.4 kg in 1 day or 05 lb/2.3 kg in a week. °· You have increasing shortness of breath that is unusual for you. °· You are unable to participate in your usual physical activities. °· You tire easily. °· You cough more than normal, especially with physical activity. °· You have any or more swelling in areas such as your hands, feet, ankles, or abdomen. °· You  are unable to sleep because it is hard to breathe. °· You feel like your heart is beating fast (palpitations). °· You become dizzy or lightheaded upon standing up. °SEEK IMMEDIATE MEDICAL CARE IF:  °· You have difficulty breathing. °· There is a change in mental status such as decreased alertness or difficulty with concentration. °· You have a pain or discomfort in your chest. °· You have an episode of fainting (syncope). °MAKE SURE YOU:  °· Understand these instructions. °· Will watch your condition. °· Will get help right away if you are not doing well or get worse. °Document Released: 06/03/2005 Document Revised: 09/28/2012 Document Reviewed: 06/25/2012 °[employer]® Patient Information ©2014 [employer]. ° °

## 2013-07-23 NOTE — Progress Notes (Signed)
Subjective: Reviewed history with patient- Pain from upper shoulder down right arm into 4th and 5th digits.  Feels better this am.  Dyspnea is also improved.  Several runs of VT noted on telemetry.  Objective: Vital signs in last 24 hours: Temp:  [94 F (34.4 C)-97.9 F (36.6 C)] 97.9 F (36.6 C) (02/06 0459) Pulse Rate:  [50-65] 54 (02/06 0459) Resp:  [15-20] 18 (02/06 0459) BP: (103-121)/(70-88) 103/74 mmHg (02/06 0459) SpO2:  [95 %-100 %] 96 % (02/06 0459) Weight:  [70.3 kg (154 lb 15.7 oz)-71.079 kg (156 lb 11.2 oz)] 70.3 kg (154 lb 15.7 oz) (02/06 0459) Weight change:  Last BM Date: 07/22/13  CBG (last 3)  No results found for this basename: GLUCAP,  in the last 72 hours  Intake/Output from previous day: 02/05 0701 - 02/06 0700 In: 240 [P.O.:240] Out: 1000 [Urine:1000] Intake/Output this shift:    General appearance: alert and no distress Eyes: no scleral icterus Throat: oropharynx moist without erythema Resp: minimal bibasilar crackles Cardio: regular rate and rhythm and with frequent ectopy GI: soft, non-tender; bowel sounds normal; no masses,  no organomegaly Extremities: no clubbing, cyanosis or edema; full range of motion bilateral shoulders.  Pain with resistance on right arm; strength 5/5.  Sensation intact.    Lab Results:  Recent Labs  07/22/13 1205  07/22/13 1525 07/22/13 1957 07/23/13 0255 07/23/13 0527  NA 142  < > 142  --  140  --   K 6.5*  < > 4.0  --  4.1 4.0  CL 103  < > 101  --  100  --   CO2 27  --   --   --  28  --   GLUCOSE 94  < > 154*  --  83  --   BUN 22  < > 20  --  17  --   CREATININE 0.96  < > 0.90 1.00 0.98  --   CALCIUM 9.0  --   --   --  9.0  --   MG  --   --   --   --  2.1  --   PHOS  --   --   --   --  3.6  --   < > = values in this interval not displayed.  Recent Labs  07/22/13 1205  AST 57*  ALT 39  ALKPHOS 65  BILITOT 0.6  PROT 7.6  ALBUMIN 3.4*    Recent Labs  07/22/13 1205  07/22/13 1957 07/23/13 0255   WBC 3.8*  --  3.9* 4.7  NEUTROABS 1.9  --   --   --   HGB 17.5*  < > 18.0* 16.5  HCT 50.2  < > 52.7* 48.4  MCV 85.4  --  86.5 85.5  PLT 152  --  148* 157  < > = values in this interval not displayed.  Magnesium 2.1  BNP    Component Value Date/Time   PROBNP 8107.0* 07/22/2013 1205    Studies/Results: Dg Chest 2 View  07/22/2013   CLINICAL DATA:  73 year old male altered mental status and shortness of Breath. Initial encounter.  EXAM: CHEST  2 VIEW  COMPARISON:  06/18/2013 and earlier.  FINDINGS: Stable lung volumes. Stable cardiomegaly and mediastinal contours. Stable retrocardiac hypoventilation. No pneumothorax, pulmonary edema or pleural effusion. No new pulmonary opacity. Stable visualized osseous structures.  IMPRESSION: Stable cardiomegaly. No acute cardiopulmonary abnormality.   Electronically Signed   By: Lars Pinks M.D.   On:  07/22/2013 13:17   Dg Cervical Spine Complete  07/22/2013   CLINICAL DATA:  Right shoulder pain.  Neck pain.  EXAM: CERVICAL SPINE  4+ VIEWS  COMPARISON:  None.  FINDINGS: No fracture or malalignment is identified. Marked loss of disc space height is seen from C2 to C7. There is multilevel facet degenerative change. Autologous fusion anteriorly is seen at C4-5. Prevertebral soft tissues appear normal. Lung apices are clear.  IMPRESSION: No acute finding.  Marked multilevel degenerative disease.   Electronically Signed   By: Inge Rise M.D.   On: 07/22/2013 21:22   Dg Shoulder Right  07/22/2013   CLINICAL DATA:  Right shoulder pain.  EXAM: RIGHT SHOULDER - 2+ VIEW  COMPARISON:  None.  FINDINGS: There is no fracture or dislocation. Mild acromioclavicular degenerative change is noted. Image right lung and ribs are unremarkable.  IMPRESSION: No acute finding.  Mild acromioclavicular degenerative disease.   Electronically Signed   By: Inge Rise M.D.   On: 07/22/2013 21:23     Medications: Scheduled: . aspirin EC  81 mg Oral Daily  . atorvastatin  40 mg  Oral q1800  . carvedilol  25 mg Oral BID WC  . fluticasone  2 spray Each Nare QHS  . furosemide  80 mg Oral BID  . gabapentin  100 mg Oral BID  . heparin  5,000 Units Subcutaneous Q8H  . lisinopril  40 mg Oral Daily  . sodium chloride  3 mL Intravenous Q12H   Continuous:   Assessment/Plan: Active Problems: 1. Right arm pain- history consistent with radiculopathy.  Plain X-rays normal except degenerative changes.  Will treat with Tramadol and Neurontin and consider imaging (CT or MRI) if persistent after 2-3 weeks of empiric treatment.  Can escalate Neurontin dose if tolerating OK but monitor for fluid retention.  2.Hyperkalemia- secondary to KCL supplementation.  Resolved.  Intolerant to Aldactone in the past due to hyperkalemia. 3. Systolic CHF, chronic secondary to Cardiomyopathy (EF 10%)- will update Echo today.  Last done in 6/09.  Continue medical therapy with Bblocker, ACE inhibitor and home dose of po Lasix.  Appears fairly euvolemic this am with minimal crackles on exam (better than baseline).   4. Paroxysmal ventricular tachycardia- discussed with patient.  He is having multiple episodes of VT on telemetry (suspect this is happening at home).  I reminded him that he could have a lethal arrythmia at any moment.  He desires FULL CODE and is now willing to reconsider AICD which he has refused in the past.  I strongly encouraged this if he feels his quality of life is good and desires aggressive treatment.  Will consult cardiology to discuss with him but if he agrees would advocate doing prior to discharge since we have a "captive audience".   5. Disposition- pending cardiology recommendations regarding AICD.  Stable for discharge home if he decides not to proceed with AICD.  Can manage arm pain as outpatient.     LOS: 1 day   Kesler Wickham,W DOUGLAS 07/23/2013, 7:44 AM

## 2013-07-23 NOTE — Progress Notes (Signed)
I visited with [name] discussing his heart failure this am.  This not a new diagnosis for him and he has some previous knowledge.  He does say that he lives alone, is fairly active and cooks his own meals.  He lives in a townhome and admits to having difficulty with the stairs.  He says he has a scale and weighs daily.  We reviewed signs and symptoms of heart failure, when to call the physician, importance of taking medications as prescribed, a low sodium diet, and fluid restriction.  He plans to return home alone at the time of discharge. I will plan to see him again if he remains in the hospital on Monday.  Carole Binning RN, BSN, PCCN--Heart Failure Art therapist

## 2013-07-23 NOTE — Discharge Summary (Signed)
[place].  MR#: [id number]  DOB:02/14/1941  Date of Admission: 07/22/2013 Date of Discharge: 07/23/2013  Attending Physician:SHAW,W DOUGLAS  Patient's LD:[id number] Nathaneil Canary, MD  Consults:Treatment Team:  Thompson Grayer, MD - Cardiology  Discharge Diagnoses: Active Problems:   Right arm pain   Systolic CHF, chronic secondary to Cardiomyopathy (EF 10%)   Hyperkalemia   Paroxysmal ventricular tachycardia    Past Medical History  Diagnosis Date  . Pneumonia 11/01  . Cardiomyopathy     refused cath and refused icd/echo.Marland KitchenMarland Kitchen6/2009 10%  LV .Marland Kitchen.15mm+  . Volume overload   . H/O: drug dependency     In the past., clean for greater than 20 years  . Ejection fraction < 50%     EF 10%, echo, June, 2009, LV 75 mm  . Mitral regurgitation     Mild by history  . Right ventricular dysfunction     Mild to moderate, echo, June, 2009  . Tobacco abuse   . Systolic CHF, chronic     Patient is refusing ICD over the years  . Substance abuse   . Stroke   . Polycythemia     Jak 2 Negative  . Shortness of breath   . Dysrhythmia 07/2013    PVT  . Hyperkalemia 07/2013    Past Surgical History  Procedure Laterality Date  . Colonoscopy    . Ingunial hernia repair x2    . Hernia repair       Discharge Medications:   Medication List    STOP taking these medications       acetaminophen 500 MG tablet  Commonly known as:  TYLENOL     potassium chloride SA 20 MEQ tablet  Commonly known as:  K-DUR,KLOR-CON      TAKE these medications       aspirin 81 MG tablet  Take 81 mg by mouth daily.     carvedilol 25 MG tablet  Commonly known as:  COREG  Take 25 mg by mouth 2 (two) times daily with a meal.     colchicine 0.6 MG tablet  Take 0.6 mg by mouth every 8 (eight) hours as needed (for gout flares).     fluticasone 50 MCG/ACT nasal spray  Commonly known as:  FLONASE  Place 2 sprays into the nose at bedtime.     fosinopril 40 MG tablet  Commonly known as:   MONOPRIL  Take 40 mg by mouth daily.     furosemide 80 MG tablet  Commonly known as:  LASIX  Take 1 tablet (80 mg total) by mouth 2 (two) times daily.     gabapentin 100 MG capsule  Commonly known as:  NEURONTIN  Take 1 capsule (100 mg total) by mouth 2 (two) times daily.     nitroGLYCERIN 0.4 MG SL tablet  Commonly known as:  NITROSTAT  Place 1 tablet (0.4 mg total) under the tongue every 5 (five) minutes as needed for chest pain.     rosuvastatin 40 MG tablet  Commonly known as:  CRESTOR  Take 40 mg by mouth daily.     traMADol 50 MG tablet  Commonly known as:  ULTRAM  Take 1 tablet (50 mg total) by mouth every 8 (eight) hours as needed for moderate pain.        Hospital Procedures: Dg Chest 2 View  07/22/2013   CLINICAL DATA:  73 year old male altered mental status and shortness of Breath. Initial encounter.  EXAM: CHEST  2 VIEW  COMPARISON:  06/18/2013 and earlier.  FINDINGS: Stable lung volumes. Stable cardiomegaly and mediastinal contours. Stable retrocardiac hypoventilation. No pneumothorax, pulmonary edema or pleural effusion. No new pulmonary opacity. Stable visualized osseous structures.  IMPRESSION: Stable cardiomegaly. No acute cardiopulmonary abnormality.   Electronically Signed   By: Lars Pinks M.D.   On: 07/22/2013 13:17   Dg Cervical Spine Complete  07/22/2013   CLINICAL DATA:  Right shoulder pain.  Neck pain.  EXAM: CERVICAL SPINE  4+ VIEWS  COMPARISON:  None.  FINDINGS: No fracture or malalignment is identified. Marked loss of disc space height is seen from C2 to C7. There is multilevel facet degenerative change. Autologous fusion anteriorly is seen at C4-5. Prevertebral soft tissues appear normal. Lung apices are clear.  IMPRESSION: No acute finding.  Marked multilevel degenerative disease.   Electronically Signed   By: Inge Rise M.D.   On: 07/22/2013 21:22   Dg Shoulder Right  07/22/2013   CLINICAL DATA:  Right shoulder pain.  EXAM: RIGHT SHOULDER - 2+ VIEW   COMPARISON:  None.  FINDINGS: There is no fracture or dislocation. Mild acromioclavicular degenerative change is noted. Image right lung and ribs are unremarkable.  IMPRESSION: No acute finding.  Mild acromioclavicular degenerative disease.   Electronically Signed   By: Inge Rise M.D.   On: 07/22/2013 21:23    Echocardiogram (07/23/12)- - Left ventricle: The cavity size was severely dilated. Wall thickness was normal. The estimated ejection fraction was 10%. Diffuse hypokinesis. There is akinesis of the entireanteroseptal myocardium. Left ventricular diastolic function parameters were normal. - Left atrium: The atrium was mildly dilated.  History of Present Illness: Mr. Bulthuis is a 73 year old African-American male with a history of severe congestive heart failure (ejection fraction 5-10%) secondary to cardiomyopathy  Who presented to the emergency department with a complaint of right arm pain.  He reports a two-week history of persistent pain from his neck down his right arm into his fourth and fifth digits.  No known injury.  Tylenol was not helpful.  He presented to the emergency department where his potassium was 6.5 without EKG changes.  He was admitted for further management.  Hospital Course: The patient was admitted to a telemetry bed.  His hyperkalemia was reversed with Kayexalate, glucose/insulin, and calcium gluconate in the emergency department.  It normalized to 4.0 on the day of discharge.  He reported some improvement in his shoulder pain after receiving morphine in the emergency department.  He was given gabapentin for neuropathic pain.  X-rays were obtained of the shoulder and neck which showed degenerative changes but no acute findings.  It is felt this pain was radicular from his neck.  Incidentally, he was noted to have recurrent V. Tach on the telemetry.  I discussed this with the patient and notified him that if suspected this was happening at home as he was  asymptomatic.  We discussed the risk of lethal cardiac rhythm.  After discussion, he stated he was we'll willing to consider AICD, which he has refused in the past.  Echocardiograms was repeated that showed an ejection fraction of 10%.  Cardiology was consulted and recommended outpatient evaluation with cardiac catheterization prior to consideration of AICD placement.they did not feel that he needed to remain inpatient for this evaluation.  Arrangements will be made through [facility] Cardiology office.  At this point, the patient is stable for discharge home.  His acute issues including hyperkalemia and right arm pain have stabilized.  He'll be discharged off of his potassium  supplement to resume prior home medications.  Prescriptions for gabapentin and tramadol be sent to his pharmacy.  Day of Discharge Exam BP 121/81  Pulse 63  Temp(Src) 97.3 F (36.3 C) (Oral)  Resp 20  Ht 5\' 11"  (1.803 m)  Wt 70.3 kg (154 lb 15.7 oz)  BMI 21.63 kg/m2  SpO2 100%  Physical Exam: General appearance: alert and no distress  Eyes: no scleral icterus  Throat: oropharynx moist without erythema  Resp: minimal bibasilar crackles  Cardio: regular rate and rhythm and with frequent ectopy  GI: soft, non-tender; bowel sounds normal; no masses, no organomegaly  Extremities: no clubbing, cyanosis or edema; full range of motion bilateral shoulders. Pain with resistance on right arm; strength 5/5. Sensation intact.   Discharge Labs:  Recent Labs  07/22/13 1205  07/22/13 1525 07/22/13 1957 07/23/13 0255 07/23/13 0527  NA 142  < > 142  --  140  --   K 6.5*  < > 4.0  --  4.1 4.0  CL 103  < > 101  --  100  --   CO2 27  --   --   --  28  --   GLUCOSE 94  < > 154*  --  83  --   BUN 22  < > 20  --  17  --   CREATININE 0.96  < > 0.90 1.00 0.98  --   CALCIUM 9.0  --   --   --  9.0  --   MG  --   --   --   --  2.1  --   PHOS  --   --   --   --  3.6  --   < > = values in this interval not displayed.  Recent Labs   07/22/13 1205  AST 57*  ALT 39  ALKPHOS 65  BILITOT 0.6  PROT 7.6  ALBUMIN 3.4*    Recent Labs  07/22/13 1205  07/22/13 1957 07/23/13 0255  WBC 3.8*  --  3.9* 4.7  NEUTROABS 1.9  --   --   --   HGB 17.5*  < > 18.0* 16.5  HCT 50.2  < > 52.7* 48.4  MCV 85.4  --  86.5 85.5  PLT 152  --  148* 157  < > = values in this interval not displayed.   Discharge instructions:     Discharge Orders   Future Orders Complete By Expires   Diet - low sodium heart healthy  As directed    Discharge instructions  As directed    Comments:     Stop potassium.Take gabapentin twice a day and tramadol as needed for pain.  If persisting, we may consider an MRI.  Follow up with cardiology for further evaluation to considerdefibrillator.  Call if chest pain, increasing shortness of breath, or swelling.  Continue to weigh daily.   Increase activity slowly  As directed       Disposition: to home   Follow-up Appts: Follow-up with Dr. Brigitte Pulse at [facility] in 1 week.    Condition on Discharge: stable  Tests Needing Follow-up: repeat BMET.    SignedJanalyn Rouse 07/23/2013, 5:34 PM

## 2013-07-23 NOTE — Progress Notes (Signed)
Pt had 14 beats of Vtach on the monitor.  Pt asymptomatic at the time.  Vitals taken.  MD has been made aware.  No new orders at this time. Gae Gallop RN

## 2013-07-23 NOTE — Progress Notes (Signed)
Echocardiogram 2D Echocardiogram has been performed.  Joelene Millin 07/23/2013, 3:36 PM

## 2013-07-23 NOTE — Consult Note (Signed)
ELECTROPHYSIOLOGY CONSULT NOTE    Patient ID: [name]. MRN: [id number], DOB/AGE: [birth year] 74 y.o.  Admit date: 07/22/2013 Date of Consult: 07-23-2013  Primary Physician: Janalyn Rouse, MD Primary Cardiologist: Daryel November, MD  Reason for Consultation: evaluate for ICD  HPI:  [name]. is a 73 y.o. male with a past medical history significant for cardiomyopathy, chronic systolic heart failure, prior stroke, and polycythemia.  He is followed closely by Dr Ron Parker in the outpatient setting.  He has had a long standing cardiomyopathy (echo 2007 EF 5-10%).  In the past he has declined ischemic evaluation or consideration for defibrillator implantation.  He lives alone and is fairly active.  He states that he does not have chest pain with exertion, shortness of breath, or palpitations.  He does have occasional bilateral lower extremity edema and orthostatic hypotension. He also complains of radicular right arm pain - xrays demonstrated degenerative disc disease.  He has also had 2 episodes of syncope - both of which occurred in the context of marijuana use (he has not used recreational drugs for several years).  He is a former smoker, but quit in 2012, he drinks occasional alcohol and no longer uses recreational drugs.   Last echo 2009 demonstrated an EF of 5-10%, severe diffuse LV hypokinesis, LA 45.  Repeat echo pending this admission.   His heart failure regimen includes coreg, lisinopril, and lasix.   Telemetry this admission has demonstrated sinus rhythm with intermittent atrial tach with aberration.  The patient has been asymptomatic with his tachyarrhythmias.    Lab work this admission has been unremarkable except for K of 6.5 on admission (specimen hemolyzed).  Potassium has been held, K 4.1 today.    EP has been asked to evaluate for treatment options.  ROS is negative except as outlined above.   Past Medical History  Diagnosis Date  . Pneumonia 11/01  . Cardiomyopathy      refused cath and refused icd/echo.Marland KitchenMarland Kitchen6/2009 10%  LV .Marland Kitchen.66mm+  . Volume overload   . H/O: drug dependency     In the past., clean for greater than 20 years  . Ejection fraction < 50%     EF 10%, echo, June, 2009, LV 75 mm  . Mitral regurgitation     Mild by history  . Right ventricular dysfunction     Mild to moderate, echo, June, 2009  . Tobacco abuse   . Systolic CHF, chronic     Patient is refusing ICD over the years  . Substance abuse   . Stroke   . Polycythemia     Jak 2 Negative  . Shortness of breath   . Dysrhythmia 07/2013    PVT  . Hyperkalemia 07/2013     Surgical History:  Past Surgical History  Procedure Laterality Date  . Colonoscopy    . Ingunial hernia repair x2    . Hernia repair       Facility-administered medications prior to admission  Medication Dose Route Frequency Provider Last Rate Last Dose  . 0.9 %  sodium chloride infusion  500 mL Intravenous Continuous Irene Shipper, MD       Prescriptions prior to admission  Medication Sig Dispense Refill  . acetaminophen (TYLENOL) 500 MG tablet Take 500 mg by mouth every 6 (six) hours as needed for mild pain or headache.       Marland Kitchen aspirin 81 MG tablet Take 81 mg by mouth daily.        . carvedilol (  COREG) 25 MG tablet Take 25 mg by mouth 2 (two) times daily with a meal.      . colchicine 0.6 MG tablet Take 0.6 mg by mouth every 8 (eight) hours as needed (for gout flares).      . fluticasone (FLONASE) 50 MCG/ACT nasal spray Place 2 sprays into the nose at bedtime.       . fosinopril (MONOPRIL) 40 MG tablet Take 40 mg by mouth daily.      . furosemide (LASIX) 80 MG tablet Take 1 tablet (80 mg total) by mouth 2 (two) times daily.  60 tablet  11  . nitroGLYCERIN (NITROSTAT) 0.4 MG SL tablet Place 1 tablet (0.4 mg total) under the tongue every 5 (five) minutes as needed for chest pain.  25 tablet  3  . potassium chloride SA (K-DUR,KLOR-CON) 20 MEQ tablet Take 1 tablet (20 mEq total) by mouth 2 (two) times daily.  60  tablet  11  . rosuvastatin (CRESTOR) 40 MG tablet Take 40 mg by mouth daily.         Inpatient Medications:  . aspirin EC  81 mg Oral Daily  . atorvastatin  40 mg Oral q1800  . carvedilol  25 mg Oral BID WC  . fluticasone  2 spray Each Nare QHS  . furosemide  80 mg Oral BID  . gabapentin  100 mg Oral BID  . heparin  5,000 Units Subcutaneous Q8H  . lisinopril  40 mg Oral Daily  . sodium chloride  3 mL Intravenous Q12H    Allergies: No Known Allergies  History   Social History  . Marital Status: Widowed    Spouse Name: N/A    Number of Children: 3  . Years of Education: N/A   Occupational History  . retired Training and development officer    Social History Main Topics  . Smoking status: Former Smoker    Types: Cigarettes    Quit date: 06/17/2010  . Smokeless tobacco: Never Used  . Alcohol Use: 1.5 oz/week    3 drink(s) per week  . Drug Use: No     Comment: hx of clean for 20+ years  . Sexual Activity: Not on file   Other Topics Concern  . Not on file   Social History Narrative  . No narrative on file     Family History  Problem Relation Age of Onset  . Diabetes Mother   . Colon cancer Neg Hx   . Esophageal cancer Neg Hx   . Stomach cancer Neg Hx   . Rectal cancer Neg Hx     Physical Exam: Filed Vitals:   07/23/13 0851 07/23/13 1058 07/23/13 1304 07/23/13 1508  BP: 108/76 118/69 127/85 121/81  Pulse: 60 62 79 63  Temp:    97.3 F (36.3 C)  TempSrc:    Oral  Resp:   20 20  Height:      Weight:      SpO2:   99% 100%    GEN- The patient is pleasant but chronically ill appearing, alert and oriented x 3 today.   Head- normocephalic, atraumatic Eyes-  Sclera clear, conjunctiva pink Ears- hearing intact Oropharynx- clear Neck- supple, + JVD Lungs- Clear to ausculation bilaterally, normal work of breathing Heart- Regular rate and rhythm, 2/6 SEM LSB GI- soft, NT, ND, + BS Extremities- no clubbing, cyanosis, + mild dependant edema MS- no significant deformity or atrophy Skin-  no rash or lesion Psych- euthymic mood, full affect Neuro- strength and sensation are intact  Labs:   Lab Results  Component Value Date   WBC 4.7 07/23/2013   HGB 16.5 07/23/2013   HCT 48.4 07/23/2013   MCV 85.5 07/23/2013   PLT 157 07/23/2013    Recent Labs Lab 07/22/13 1205  07/23/13 0255 07/23/13 0527  NA 142  < > 140  --   K 6.5*  < > 4.1 4.0  CL 103  < > 100  --   CO2 27  --  28  --   BUN 22  < > 17  --   CREATININE 0.96  < > 0.98  --   CALCIUM 9.0  --  9.0  --   PROT 7.6  --   --   --   BILITOT 0.6  --   --   --   ALKPHOS 65  --   --   --   ALT 39  --   --   --   AST 57*  --   --   --   GLUCOSE 94  < > 83  --   < > = values in this interval not displayed.   Radiology/Studies: Dg Chest 2 View 07/22/2013   CLINICAL DATA:  73 year old male altered mental status and shortness of Breath. Initial encounter.  EXAM: CHEST  2 VIEW  COMPARISON:  06/18/2013 and earlier.  FINDINGS: Stable lung volumes. Stable cardiomegaly and mediastinal contours. Stable retrocardiac hypoventilation. No pneumothorax, pulmonary edema or pleural effusion. No new pulmonary opacity. Stable visualized osseous structures.  IMPRESSION: Stable cardiomegaly. No acute cardiopulmonary abnormality.   Electronically Signed   By: Lars Pinks M.D.   On: 07/22/2013 13:17   Dg Cervical Spine Complete 07/22/2013   CLINICAL DATA:  Right shoulder pain.  Neck pain.  EXAM: CERVICAL SPINE  4+ VIEWS  COMPARISON:  None.  FINDINGS: No fracture or malalignment is identified. Marked loss of disc space height is seen from C2 to C7. There is multilevel facet degenerative change. Autologous fusion anteriorly is seen at C4-5. Prevertebral soft tissues appear normal. Lung apices are clear.  IMPRESSION: No acute finding.  Marked multilevel degenerative disease.   Electronically Signed   By: Inge Rise M.D.   On: 07/22/2013 21:22   Dg Shoulder Right 07/22/2013   CLINICAL DATA:  Right shoulder pain.  EXAM: RIGHT SHOULDER - 2+ VIEW  COMPARISON:   None.  FINDINGS: There is no fracture or dislocation. Mild acromioclavicular degenerative change is noted. Image right lung and ribs are unremarkable.  IMPRESSION: No acute finding.  Mild acromioclavicular degenerative disease.   Electronically Signed   By: Inge Rise M.D.   On: 07/22/2013 21:23   FIE:PPIRJ brady, rate 52, 1st degree AV block (PR 224), IVCD (QRS 130)  TELEMETRY: sinus rhythm with periods of atrial tach with aberration, NSVT  Assessment and Plan:  The patient has chronic systolic dysfunction with EF 10-15% chronically and NYHA Class III CHF.  He is surprisingly compensated for his EF.  He has been followed by Dr Ron Parker for a long time and is on the appropriate medicines.  He has been advised to have an ischemic eval previously but has declined. EP is asked to see for ICD assessment.  The patient does have NSVT but requires ischemic evaluation before we can consider an ICD.  The patient says that he is now more amenable to having a cath. Risks, benefits, and alternatives to right and left heart catheterization were discussed at length with the patient and his spouse and he  wishes to proceed. We will schedule elective outpatient cath next week. I think that given his severely reduced EF, it would be beneficial to have him evaluated electively by the advanced CHF team also.  I will try to coordinate this at the time of his cath. No further inpatient EP workup is planned. His cath is scheduled for Tuesday 07/27/13 at 7:30 am.  He should arrive at 6:30 am to short stay.  NPO after midnight on Monday.

## 2013-07-23 NOTE — Progress Notes (Signed)
Pt had another 5 beat then 3 beat run of V-tach, no s/s , MD notified, they ordered labs K, Ph, Mg, will continue to monitor, thanks Arvella Nigh RN

## 2013-07-26 ENCOUNTER — Telehealth: Payer: Self-pay | Admitting: Cardiology

## 2013-07-26 NOTE — Telephone Encounter (Signed)
Spoke with rosa, the pt is scheduled for a cath tomorrow and needed to know what medication to take. Med list confirmed with pts list. Pt instructed to take all his meds except to hold the furosemide. Rosa voiced understanding.

## 2013-07-26 NOTE — Telephone Encounter (Signed)
New Prob    Has some questions regarding pts current medication list. Please call.

## 2013-07-27 ENCOUNTER — Inpatient Hospital Stay (HOSPITAL_COMMUNITY)
Admission: RE | Admit: 2013-07-27 | Discharge: 2013-07-29 | DRG: 287 | Disposition: A | Discharge status: Home / Self Care | Payer: Medicare PPO | Source: Ambulatory Visit | Attending: Internal Medicine | Admitting: Internal Medicine

## 2013-07-27 ENCOUNTER — Encounter (HOSPITAL_COMMUNITY): Payer: Self-pay | Admitting: *Deleted

## 2013-07-27 ENCOUNTER — Encounter (HOSPITAL_COMMUNITY)
Admission: RE | Disposition: A | Discharge status: Home / Self Care | Payer: Self-pay | Source: Ambulatory Visit | Attending: Internal Medicine

## 2013-07-27 DIAGNOSIS — Z8673 Personal history of transient ischemic attack (TIA), and cerebral infarction without residual deficits: Secondary | ICD-10-CM

## 2013-07-27 DIAGNOSIS — IMO0002 Reserved for concepts with insufficient information to code with codable children: Secondary | ICD-10-CM | POA: Diagnosis present

## 2013-07-27 DIAGNOSIS — I509 Heart failure, unspecified: Secondary | ICD-10-CM | POA: Diagnosis present

## 2013-07-27 DIAGNOSIS — I5023 Acute on chronic systolic (congestive) heart failure: Principal | ICD-10-CM | POA: Diagnosis present

## 2013-07-27 DIAGNOSIS — I429 Cardiomyopathy, unspecified: Secondary | ICD-10-CM

## 2013-07-27 DIAGNOSIS — D45 Polycythemia vera: Secondary | ICD-10-CM | POA: Diagnosis present

## 2013-07-27 DIAGNOSIS — M109 Gout, unspecified: Secondary | ICD-10-CM | POA: Diagnosis present

## 2013-07-27 DIAGNOSIS — I472 Ventricular tachycardia, unspecified: Secondary | ICD-10-CM | POA: Diagnosis present

## 2013-07-27 DIAGNOSIS — Z9181 History of falling: Secondary | ICD-10-CM

## 2013-07-27 DIAGNOSIS — I428 Other cardiomyopathies: Secondary | ICD-10-CM | POA: Diagnosis present

## 2013-07-27 DIAGNOSIS — I4729 Other ventricular tachycardia: Secondary | ICD-10-CM | POA: Diagnosis present

## 2013-07-27 DIAGNOSIS — Z87891 Personal history of nicotine dependence: Secondary | ICD-10-CM

## 2013-07-27 DIAGNOSIS — Z66 Do not resuscitate: Secondary | ICD-10-CM | POA: Diagnosis not present

## 2013-07-27 DIAGNOSIS — Z515 Encounter for palliative care: Secondary | ICD-10-CM

## 2013-07-27 DIAGNOSIS — I059 Rheumatic mitral valve disease, unspecified: Secondary | ICD-10-CM | POA: Diagnosis present

## 2013-07-27 HISTORY — PX: LEFT AND RIGHT HEART CATHETERIZATION WITH CORONARY ANGIOGRAM: SHX5449

## 2013-07-27 LAB — BASIC METABOLIC PANEL
BUN: 19 mg/dL (ref 6–23)
CHLORIDE: 99 meq/L (ref 96–112)
CO2: 28 mEq/L (ref 19–32)
Calcium: 9.3 mg/dL (ref 8.4–10.5)
Creatinine, Ser: 1.02 mg/dL (ref 0.50–1.35)
GFR calc non Af Amer: 71 mL/min — ABNORMAL LOW (ref >90)
GFR, EST AFRICAN AMERICAN: 82 mL/min — AB (ref >90)
Glucose, Bld: 148 mg/dL — ABNORMAL HIGH (ref 70–99)
Potassium: 3.7 mEq/L (ref 3.7–5.3)
Sodium: 141 mEq/L (ref 137–147)

## 2013-07-27 LAB — CARBOXYHEMOGLOBIN
CARBOXYHEMOGLOBIN: 2 % — AB (ref 0.5–1.5)
METHEMOGLOBIN: 1.4 % (ref 0.0–1.5)
O2 Saturation: 78.1 %
Total hemoglobin: 17.5 g/dL (ref 13.5–18.0)

## 2013-07-27 LAB — POCT I-STAT 3, ART BLOOD GAS (G3+)
ACID-BASE EXCESS: 2 mmol/L (ref 0.0–2.0)
Acid-Base Excess: 1 mmol/L (ref 0.0–2.0)
BICARBONATE: 27.6 meq/L — AB (ref 20.0–24.0)
Bicarbonate: 26.9 mEq/L — ABNORMAL HIGH (ref 20.0–24.0)
O2 SAT: 85 %
O2 Saturation: 85 %
PCO2 ART: 44 mmHg (ref 35.0–45.0)
PH ART: 7.394 (ref 7.350–7.450)
PO2 ART: 50 mmHg — AB (ref 80.0–100.0)
TCO2: 29 mmol/L (ref 0–100)
TCO2: 28 mmol/L (ref 0–100)
pCO2 arterial: 44.3 mmHg (ref 35.0–45.0)
pH, Arterial: 7.403 (ref 7.350–7.450)
pO2, Arterial: 51 mmHg — ABNORMAL LOW (ref 80.0–100.0)

## 2013-07-27 LAB — CBC
HEMATOCRIT: 48.7 % (ref 39.0–52.0)
HEMOGLOBIN: 17 g/dL (ref 13.0–17.0)
MCH: 29.5 pg (ref 26.0–34.0)
MCHC: 34.9 g/dL (ref 30.0–36.0)
MCV: 84.4 fL (ref 78.0–100.0)
Platelets: 161 10*3/uL (ref 150–400)
RBC: 5.77 MIL/uL (ref 4.22–5.81)
RDW: 16.5 % — ABNORMAL HIGH (ref 11.5–15.5)
WBC: 3.8 10*3/uL — AB (ref 4.0–10.5)

## 2013-07-27 LAB — PRO B NATRIURETIC PEPTIDE: PRO B NATRI PEPTIDE: 17069 pg/mL — AB (ref 0–125)

## 2013-07-27 LAB — POCT I-STAT 3, VENOUS BLOOD GAS (G3P V)
Bicarbonate: 26.7 mEq/L — ABNORMAL HIGH (ref 20.0–24.0)
Bicarbonate: 26.8 mEq/L — ABNORMAL HIGH (ref 20.0–24.0)
O2 SAT: 51 %
O2 Saturation: 54 %
PCO2 VEN: 51.1 mmHg — AB (ref 45.0–50.0)
PO2 VEN: 29 mmHg — AB (ref 30.0–45.0)
TCO2: 28 mmol/L (ref 0–100)
TCO2: 28 mmol/L (ref 0–100)
pCO2, Ven: 49.6 mmHg (ref 45.0–50.0)
pH, Ven: 7.326 — ABNORMAL HIGH (ref 7.250–7.300)
pH, Ven: 7.34 — ABNORMAL HIGH (ref 7.250–7.300)
pO2, Ven: 31 mmHg (ref 30.0–45.0)

## 2013-07-27 LAB — PROTIME-INR
INR: 0.98 (ref 0.00–1.49)
PROTHROMBIN TIME: 12.8 s (ref 11.6–15.2)

## 2013-07-27 LAB — MRSA PCR SCREENING: MRSA BY PCR: NEGATIVE

## 2013-07-27 SURGERY — LEFT AND RIGHT HEART CATHETERIZATION WITH CORONARY ANGIOGRAM
Anesthesia: LOCAL

## 2013-07-27 MED ORDER — FUROSEMIDE 10 MG/ML IJ SOLN
80.0000 mg | Freq: Two times a day (BID) | INTRAMUSCULAR | Status: DC
  Administered 2013-07-27: 80 mg via INTRAVENOUS
  Filled 2013-07-27 (×3): qty 8

## 2013-07-27 MED ORDER — SODIUM CHLORIDE 0.9 % IJ SOLN: 3.0000 mL | INTRAMUSCULAR | Status: DC | PRN

## 2013-07-27 MED ORDER — DIGOXIN 125 MCG PO TABS
0.1250 mg | ORAL_TABLET | Freq: Every day | ORAL | Status: DC
  Administered 2013-07-28 – 2013-07-29 (×2): 0.125 mg via ORAL
  Filled 2013-07-27 (×3): qty 1

## 2013-07-27 MED ORDER — SODIUM CHLORIDE 0.9 % IJ SOLN: 3.0000 mL | Freq: Two times a day (BID) | INTRAMUSCULAR | Status: DC

## 2013-07-27 MED ORDER — TRAMADOL HCL 50 MG PO TABS
50.0000 mg | ORAL_TABLET | Freq: Four times a day (QID) | ORAL | Status: DC | PRN
  Administered 2013-07-27 – 2013-07-28 (×2): 50 mg via ORAL
  Filled 2013-07-27 (×2): qty 1

## 2013-07-27 MED ORDER — FUROSEMIDE 10 MG/ML IJ SOLN
INTRAMUSCULAR | Status: AC
  Filled 2013-07-27: qty 8

## 2013-07-27 MED ORDER — HEPARIN (PORCINE) IN NACL 2-0.9 UNIT/ML-% IJ SOLN
INTRAMUSCULAR | Status: AC
  Filled 2013-07-27: qty 1000

## 2013-07-27 MED ORDER — FUROSEMIDE 10 MG/ML IJ SOLN
80.0000 mg | Freq: Once | INTRAMUSCULAR | Status: AC
  Administered 2013-07-27: 80 mg via INTRAVENOUS

## 2013-07-27 MED ORDER — LISINOPRIL 5 MG PO TABS
5.0000 mg | ORAL_TABLET | Freq: Every day | ORAL | Status: DC
  Filled 2013-07-27: qty 1

## 2013-07-27 MED ORDER — ATORVASTATIN CALCIUM 80 MG PO TABS
80.0000 mg | ORAL_TABLET | Freq: Every day | ORAL | Status: DC
  Administered 2013-07-27 – 2013-07-28 (×2): 80 mg via ORAL
  Filled 2013-07-27 (×3): qty 1

## 2013-07-27 MED ORDER — NITROGLYCERIN 0.2 MG/ML ON CALL CATH LAB
INTRAVENOUS | Status: AC
  Filled 2013-07-27: qty 1

## 2013-07-27 MED ORDER — MIDAZOLAM HCL 2 MG/2ML IJ SOLN
INTRAMUSCULAR | Status: AC
  Filled 2013-07-27: qty 2

## 2013-07-27 MED ORDER — ACETAMINOPHEN 325 MG PO TABS: 650.0000 mg | ORAL_TABLET | ORAL | Status: DC | PRN

## 2013-07-27 MED ORDER — ASPIRIN 81 MG PO CHEW
81.0000 mg | CHEWABLE_TABLET | ORAL | Status: AC
  Administered 2013-07-27: 81 mg via ORAL
  Filled 2013-07-27: qty 1

## 2013-07-27 MED ORDER — SODIUM CHLORIDE 0.9 % IJ SOLN: 10.0000 mL | INTRAMUSCULAR | Status: DC | PRN

## 2013-07-27 MED ORDER — FENTANYL CITRATE 0.05 MG/ML IJ SOLN
INTRAMUSCULAR | Status: AC
  Filled 2013-07-27: qty 2

## 2013-07-27 MED ORDER — CARVEDILOL 12.5 MG PO TABS
12.5000 mg | ORAL_TABLET | Freq: Two times a day (BID) | ORAL | Status: DC
  Administered 2013-07-27 – 2013-07-29 (×4): 12.5 mg via ORAL
  Filled 2013-07-27 (×6): qty 1

## 2013-07-27 MED ORDER — ENOXAPARIN SODIUM 30 MG/0.3ML ~~LOC~~ SOLN
30.0000 mg | SUBCUTANEOUS | Status: DC
  Administered 2013-07-27: 30 mg via SUBCUTANEOUS
  Filled 2013-07-27 (×2): qty 0.3

## 2013-07-27 MED ORDER — SODIUM CHLORIDE 0.9 % IV SOLN: 250.0000 mL | INTRAVENOUS | Status: DC | PRN

## 2013-07-27 MED ORDER — SODIUM CHLORIDE 0.9 % IV SOLN
INTRAVENOUS | Status: DC
  Administered 2013-07-27: 08:00:00 via INTRAVENOUS

## 2013-07-27 MED ORDER — ONDANSETRON HCL 4 MG/2ML IJ SOLN: 4.0000 mg | Freq: Four times a day (QID) | INTRAMUSCULAR | Status: DC | PRN

## 2013-07-27 MED ORDER — SODIUM CHLORIDE 0.9 % IJ SOLN
10.0000 mL | Freq: Two times a day (BID) | INTRAMUSCULAR | Status: DC
  Administered 2013-07-28: 10 mL

## 2013-07-27 MED ORDER — ASPIRIN EC 81 MG PO TBEC
81.0000 mg | DELAYED_RELEASE_TABLET | Freq: Every day | ORAL | Status: DC
  Administered 2013-07-28 – 2013-07-29 (×2): 81 mg via ORAL
  Filled 2013-07-27 (×3): qty 1

## 2013-07-27 MED ORDER — LIDOCAINE HCL (PF) 1 % IJ SOLN
INTRAMUSCULAR | Status: AC
  Filled 2013-07-27: qty 30

## 2013-07-27 NOTE — Plan of Care (Signed)
Problem: Consults Goal: Heart Failure Patient Education (See Patient Education module for education specifics.)  Outcome: Completed/Met Date Met:  07/27/13 chf book given

## 2013-07-27 NOTE — H&P (Signed)
ADVANCED HEART FAILURE H&P PCP: Dr [name] Cardiologist: Dr [name]  HPI: [name] is a 73 year old with history of cardiomyopathy, chronic systolic heart failure dating back to 2009 with recent EF % 07/2013, prior stroke, gout, and polycythemia.   He has been followed by Dr [name] for HF. He last saw Paul Dopp PA 06/18/13. At that time lasix was increased to 80 mg bid. He refused ICD.   He was last admitted 2/5 through 07/23/13 due to hyperkalemia. Treated kayexalate, glucose/insuline, and ca gluconate. EP consulted due to NSVT and possible ICD. Dr [name] recommended outpatient ischemic work up.   He presented today for RHC/LHC. As noted below he had markedly elevated filling pressures with low cardiac output. Lives alone.   RHC/LHC 07/27/13  RA = 9  RV = 65/7/12  PA = 76/23 (49)  PCW = 34  Fick cardiac output/index = 3.5/1.9  Thermo CO/CI = 3.8/2.0  PVR = 4.5 WU  SVR = 2000  FA sat = 85% (checked twice)  PA sat = 51%, 54% Normal Cors.     Review of Systems:     Cardiac Review of Systems: {Y] = yes [ ]  = no  Chest Pain [    ]  Resting SOB [   ] Exertional SOB  [  ]  Orthopnea [  ]   Pedal Edema [   ]    Palpitations [  ] Syncope  [  ]   Presyncope [   ]  General Review of Systems: [Y] = yes [  ]=no Constitional: recent weight change [  ]; anorexia [  ]; fatigue [ Y ]; nausea [  ]; night sweats [  ]; fever [  ]; or chills [  ];                                                                                                                                          Dental: poor dentition[  ]; Last Dentist visit:   Eye : blurred vision [  ]; diplopia [   ]; vision changes [  ];  Amaurosis fugax[  ]; Resp: cough [  ];  wheezing[  ];  hemoptysis[  ]; shortness of breath[ Y ]; paroxysmal nocturnal dyspnea[  ]; dyspnea on exertion[ Y ]; or orthopnea[  ];  GI:  gallstones[  ], vomiting[  ];  dysphagia[  ]; melena[  ];  hematochezia [  ]; heartburn[  ];   Hx of  Colonoscopy[  ]; GU: kidney  stones [  ]; hematuria[  ];   dysuria [  ];  nocturia[  ];  history of     obstruction [  ];                 Skin: rash, swelling[  ];, hair loss[  ];  peripheral edema[  ];  or itching[  ];  Musculosketetal: myalgias[  ];  joint swelling[  ];  joint erythema[  ];  joint pain[ Y ];  back pain[  ];  Heme/Lymph: bruising[  ];  bleeding[  ];  anemia[  ];  Neuro: TIA[  ];  headaches[  ];  stroke[  ];  vertigo[  ];  seizures[  ];   paresthesias[  ];  difficulty walking[  ];  Psych:depression[  ]; anxiety[  ];  Endocrine: diabetes[  ];  thyroid dysfunction[  ];  Immunizations: Flu [  ]; Pneumococcal[  ];  Other:  Past Medical History  Diagnosis Date  . Pneumonia 11/01  . Cardiomyopathy     refused cath and refused icd/echo.Marland KitchenMarland Kitchen6/2009 10%  LV .Marland Kitchen.54mm+  . Volume overload   . H/O: drug dependency     In the past., clean for greater than 20 years  . Ejection fraction < 50%     EF 10%, echo, June, 2009, LV 75 mm  . Mitral regurgitation     Mild by history  . Right ventricular dysfunction     Mild to moderate, echo, June, 2009  . Tobacco abuse   . Systolic CHF, chronic     Patient is refusing ICD over the years  . Substance abuse   . Stroke   . Polycythemia     Jak 2 Negative  . Shortness of breath   . Dysrhythmia 07/2013    PVT  . Hyperkalemia 07/2013    Medications Prior to Admission  Medication Sig Dispense Refill  . aspirin 81 MG tablet Take 81 mg by mouth daily.        . carvedilol (COREG) 25 MG tablet Take 25 mg by mouth 2 (two) times daily with a meal.      . colchicine 0.6 MG tablet Take 0.6 mg by mouth every 8 (eight) hours as needed (for gout flares).      . fluticasone (FLONASE) 50 MCG/ACT nasal spray Place 2 sprays into the nose at bedtime.       . fosinopril (MONOPRIL) 40 MG tablet Take 40 mg by mouth daily.      . furosemide (LASIX) 80 MG tablet Take 1 tablet (80 mg total) by mouth 2 (two) times daily.  60 tablet  11  . gabapentin (NEURONTIN) 100 MG capsule Take 1 capsule  (100 mg total) by mouth 2 (two) times daily.  60 capsule  6  . rosuvastatin (CRESTOR) 40 MG tablet Take 40 mg by mouth daily.       . traMADol (ULTRAM) 50 MG tablet Take 1 tablet (50 mg total) by mouth every 8 (eight) hours as needed for moderate pain.  90 tablet  3  . nitroGLYCERIN (NITROSTAT) 0.4 MG SL tablet Place 1 tablet (0.4 mg total) under the tongue every 5 (five) minutes as needed for chest pain.  25 tablet  3     No Known Allergies  History   Social History  . Marital Status: Widowed    Spouse Name: N/A    Number of Children: 3  . Years of Education: N/A   Occupational History  . retired Training and development officer    Social History Main Topics  . Smoking status: Former Smoker    Types: Cigarettes    Quit date: 06/17/2010  . Smokeless tobacco: Never Used  . Alcohol Use: 1.5 oz/week    3 drink(s) per week  . Drug Use: No     Comment: hx of clean for 20+ years  . Sexual Activity: Not on  file   Other Topics Concern  . Not on file   Social History Narrative  . No narrative on file    Family History  Problem Relation Age of Onset  . Diabetes Mother   . Colon cancer Neg Hx   . Esophageal cancer Neg Hx   . Stomach cancer Neg Hx   . Rectal cancer Neg Hx     PHYSICAL EXAM: Filed Vitals:   07/27/13 0632  BP: 130/96  Potts: 82  Temp: 97.8 F (36.6 C)  Resp: 20   General:  Well appearing. No respiratory difficulty HEENT: normal Neck: supple. JVD to jaw.  Carotids 2+ bilat; no bruits. No lymphadenopathy or thryomegaly appreciated. Cor: PMI nondisplaced. Regular rate & rhythm. No rubs, or  Murmurs. + S3  Lungs: clear Abdomen: soft, nontender, nondistended. No hepatosplenomegaly. No bruits or masses. Good bowel sounds. Extremities: no cyanosis, clubbing, rash, edema Neuro: alert & oriented x 3, cranial nerves grossly intact. moves all 4 extremities w/o difficulty. Affect pleasant.    Results for orders placed during the hospital encounter of 07/27/13 (from the past 24 hour(s))   PROTIME-INR     Status: None   Collection Time    07/27/13  7:22 AM      Result Value Range   Prothrombin Time 12.8  11.6 - 15.2 seconds   INR 0.98  0.00 - 1.49   No results found.   ASSESSMENT: 1. A/C Systolic Heart Failure EF 5%  2. NICM ---> cath 07/27/13 3. NSVT 4. Gout      PLAN/DISCUSSION: Paul Winterbottom presented for RHC/LHC today which showed elevated filling pressures, low cardiac output, and normal cors. He is being admitted for diuresis and to tune up HF. Will start by cutting back b-blocker and diuresing with IV lasix. Suspect he will need to start milrinone as well. Place PICC for CO-OX and CVPs.   CLEGG,AMYNP-C  11:27 AM  Advanced Heart Failure Team Pager [phone] (M-F; [place])  Please contact [facility] for night-coverage after hours (4p -7a ) and weekends on [email or web address]  Patient seen and examined with Darrick Grinder, NP. We discussed all aspects of the encounter. I agree with the assessment and plan as stated above.   Paul. Dauzat has advanced HF(NYHA IIIB-IV) with marked volume overload and low cardiac output. He has experienced a marked functional decline over the past few weeks. I spoke with him and he close male friend about advanced HF options but it is not clear yet what is most appropriate. Will admit for IV diuresis. Start with cutting b-blocker in half. Follow CVPs and co-ox. Suspect he will need inotropic support watching carefully for increased ectopy. He is not candidate for ICD given predicted mortality < 1 year at this point.   Daniel Bensimhon,MD 5:30 PM

## 2013-07-27 NOTE — Progress Notes (Signed)
Peripherally Inserted Central Catheter/Midline Placement  The IV Nurse has discussed with the patient and/or persons authorized to consent for the patient, the purpose of this procedure and the potential benefits and risks involved with this procedure.  The benefits include less needle sticks, lab draws from the catheter and patient may be discharged home with the catheter.  Risks include, but not limited to, infection, bleeding, blood clot (thrombus formation), and puncture of an artery; nerve damage and irregular heat beat.  Alternatives to this procedure were also discussed.  PICC/Midline Placement Documentation        [name] 07/27/2013, 5:29 PM

## 2013-07-27 NOTE — CV Procedure (Signed)
Cardiac Cath Procedure Note  Indication: Heart failure  Procedures performed:  1) Right heart cathererization 2) Selective coronary angiography 3) Left heart catheterization 4) Left ventriculogram  Description of procedure:     The risks and indication of the procedure were explained. Consent was signed and placed on the chart. An appropriate timeout was taken prior to the procedure. The right groin was prepped and draped in the routine sterile fashion and anesthetized with 1% local lidocaine.   A 5 FR arterial sheath was placed in the right femoral artery using a modified Seldinger technique. Standard catheters including a JL4, JR4 and angled pigtail were used. All catheter exchanges were made over a wire. A 7 FR venous sheath was placed in the right femoral vein using a modified Seldinger technique. A standard Swan-Ganz catheter was used for the procedure.   Complications:  None apparent  Findings:  RA = 9 RV = 65/7/12 PA = 76/23 (49) PCW = 34 Fick cardiac output/index = 3.5/1.9 Thermo CO/CI = 3.8/2.0 PVR = 4.5 WU SVR = 2000 FA sat = 85% (checked twice) PA sat = 51%, 54%  Ao Pressure: 126/76 (97) LV Pressure:  123/16/36 There was no signficant gradient across the aortic valve on pullback.  Left main: Short. Normal  LAD: Long vessel wraps apex. Multiple diagonals. normal   LCX: Moderate-sized vessel. 2 OMS normal  RCA: Dominant vessel. Normal  LV-gram done in the RAO projection: Markedly dilated with severe global HK Ejection fraction = 5%  Assessment: 1. Severe NICM with EF 5% 2. Markedly elevated filling pressures with low cardiac output  Plan/Discussion:  Will likely need admission for diuresis and consideration of inotropic support. I will d/w Dr. Ron Parker and Dr. Rayann Heman.   Zaylyn Bergdoll,MD 9:47 AM

## 2013-07-27 NOTE — Interval H&P Note (Signed)
History and Physical Interval Note:  07/27/2013 9:15 AM  [name].  has presented today for surgery, with the diagnosis of Chest pain  The various methods of treatment have been discussed with the patient and family. After consideration of risks, benefits and other options for treatment, the patient has consented to  Procedure(s): LEFT AND RIGHT HEART CATHETERIZATION WITH CORONARY ANGIOGRAM (N/A)and possible angioplastyCath Lab Visit (complete for each Cath Lab visit)  Clinical Evaluation Leading to the Procedure:   ACS: no  Non-ACS:    Anginal Classification: CCS IV  Anti-ischemic medical therapy: Maximal Therapy (2 or more classes of medications)  Non-Invasive Test Results: No non-invasive testing performed  Prior CABG: No previous CABG       as a surgical intervention .  The patient's history has been reviewed, patient examined, no change in status, stable for surgery.  I have reviewed the patient's chart and labs.  Questions were answered to the patient's satisfaction.     Gabriele Zwilling

## 2013-07-27 NOTE — H&P (View-Only) (Signed)
ELECTROPHYSIOLOGY CONSULT NOTE    Patient ID: [name]. MRN: [id number], DOB/AGE: [id number] y.o.  Admit date: 07/22/2013 Date of Consult: 07-23-2013  Primary Physician: Janalyn Rouse, MD Primary Cardiologist: Daryel November, MD  Reason for Consultation: evaluate for ICD  HPI:  [name]. is a 73 y.o. male with a past medical history significant for cardiomyopathy, chronic systolic heart failure, prior stroke, and polycythemia.  He is followed closely by Dr Ron Parker in the outpatient setting.  He has had a long standing cardiomyopathy (echo 2007 EF 5-10%).  In the past he has declined ischemic evaluation or consideration for defibrillator implantation.  He lives alone and is fairly active.  He states that he does not have chest pain with exertion, shortness of breath, or palpitations.  He does have occasional bilateral lower extremity edema and orthostatic hypotension. He also complains of radicular right arm pain - xrays demonstrated degenerative disc disease.  He has also had 2 episodes of syncope - both of which occurred in the context of marijuana use (he has not used recreational drugs for several years).  He is a former smoker, but quit in 2012, he drinks occasional alcohol and no longer uses recreational drugs.   Last echo 2009 demonstrated an EF of 5-10%, severe diffuse LV hypokinesis, LA 45.  Repeat echo pending this admission.   His heart failure regimen includes coreg, lisinopril, and lasix.   Telemetry this admission has demonstrated sinus rhythm with intermittent atrial tach with aberration.  The patient has been asymptomatic with his tachyarrhythmias.    Lab work this admission has been unremarkable except for K of 6.5 on admission (specimen hemolyzed).  Potassium has been held, K 4.1 today.    EP has been asked to evaluate for treatment options.  ROS is negative except as outlined above.   Past Medical History  Diagnosis Date  . Pneumonia 11/01  . Cardiomyopathy      refused cath and refused icd/echo.Marland KitchenMarland Kitchen6/2009 10%  LV .Marland Kitchen.66mm+  . Volume overload   . H/O: drug dependency     In the past., clean for greater than 20 years  . Ejection fraction < 50%     EF 10%, echo, June, 2009, LV 75 mm  . Mitral regurgitation     Mild by history  . Right ventricular dysfunction     Mild to moderate, echo, June, 2009  . Tobacco abuse   . Systolic CHF, chronic     Patient is refusing ICD over the years  . Substance abuse   . Stroke   . Polycythemia     Jak 2 Negative  . Shortness of breath   . Dysrhythmia 07/2013    PVT  . Hyperkalemia 07/2013     Surgical History:  Past Surgical History  Procedure Laterality Date  . Colonoscopy    . Ingunial hernia repair x2    . Hernia repair       Facility-administered medications prior to admission  Medication Dose Route Frequency Provider Last Rate Last Dose  . 0.9 %  sodium chloride infusion  500 mL Intravenous Continuous Irene Shipper, MD       Prescriptions prior to admission  Medication Sig Dispense Refill  . acetaminophen (TYLENOL) 500 MG tablet Take 500 mg by mouth every 6 (six) hours as needed for mild pain or headache.       Marland Kitchen aspirin 81 MG tablet Take 81 mg by mouth daily.        . carvedilol (  COREG) 25 MG tablet Take 25 mg by mouth 2 (two) times daily with a meal.      . colchicine 0.6 MG tablet Take 0.6 mg by mouth every 8 (eight) hours as needed (for gout flares).      . fluticasone (FLONASE) 50 MCG/ACT nasal spray Place 2 sprays into the nose at bedtime.       . fosinopril (MONOPRIL) 40 MG tablet Take 40 mg by mouth daily.      . furosemide (LASIX) 80 MG tablet Take 1 tablet (80 mg total) by mouth 2 (two) times daily.  60 tablet  11  . nitroGLYCERIN (NITROSTAT) 0.4 MG SL tablet Place 1 tablet (0.4 mg total) under the tongue every 5 (five) minutes as needed for chest pain.  25 tablet  3  . potassium chloride SA (K-DUR,KLOR-CON) 20 MEQ tablet Take 1 tablet (20 mEq total) by mouth 2 (two) times daily.  60  tablet  11  . rosuvastatin (CRESTOR) 40 MG tablet Take 40 mg by mouth daily.         Inpatient Medications:  . aspirin EC  81 mg Oral Daily  . atorvastatin  40 mg Oral q1800  . carvedilol  25 mg Oral BID WC  . fluticasone  2 spray Each Nare QHS  . furosemide  80 mg Oral BID  . gabapentin  100 mg Oral BID  . heparin  5,000 Units Subcutaneous Q8H  . lisinopril  40 mg Oral Daily  . sodium chloride  3 mL Intravenous Q12H    Allergies: No Known Allergies  History   Social History  . Marital Status: Widowed    Spouse Name: N/A    Number of Children: 3  . Years of Education: N/A   Occupational History  . retired cook    Social History Main Topics  . Smoking status: Former Smoker    Types: Cigarettes    Quit date: 06/17/2010  . Smokeless tobacco: Never Used  . Alcohol Use: 1.5 oz/week    3 drink(s) per week  . Drug Use: No     Comment: hx of clean for 20+ years  . Sexual Activity: Not on file   Other Topics Concern  . Not on file   Social History Narrative  . No narrative on file     Family History  Problem Relation Age of Onset  . Diabetes Mother   . Colon cancer Neg Hx   . Esophageal cancer Neg Hx   . Stomach cancer Neg Hx   . Rectal cancer Neg Hx     Physical Exam: Filed Vitals:   07/23/13 0851 07/23/13 1058 07/23/13 1304 07/23/13 1508  BP: 108/76 118/69 127/85 121/81  Pulse: 60 62 79 63  Temp:    97.3 F (36.3 C)  TempSrc:    Oral  Resp:   20 20  Height:      Weight:      SpO2:   99% 100%    GEN- The patient is pleasant but chronically ill appearing, alert and oriented x 3 today.   Head- normocephalic, atraumatic Eyes-  Sclera clear, conjunctiva pink Ears- hearing intact Oropharynx- clear Neck- supple, + JVD Lungs- Clear to ausculation bilaterally, normal work of breathing Heart- Regular rate and rhythm, 2/6 SEM LSB GI- soft, NT, ND, + BS Extremities- no clubbing, cyanosis, + mild dependant edema MS- no significant deformity or atrophy Skin-  no rash or lesion Psych- euthymic mood, full affect Neuro- strength and sensation are intact      Labs:   Lab Results  Component Value Date   WBC 4.7 07/23/2013   HGB 16.5 07/23/2013   HCT 48.4 07/23/2013   MCV 85.5 07/23/2013   PLT 157 07/23/2013    Recent Labs Lab 07/22/13 1205  07/23/13 0255 07/23/13 0527  NA 142  < > 140  --   K 6.5*  < > 4.1 4.0  CL 103  < > 100  --   CO2 27  --  28  --   BUN 22  < > 17  --   CREATININE 0.96  < > 0.98  --   CALCIUM 9.0  --  9.0  --   PROT 7.6  --   --   --   BILITOT 0.6  --   --   --   ALKPHOS 65  --   --   --   ALT 39  --   --   --   AST 57*  --   --   --   GLUCOSE 94  < > 83  --   < > = values in this interval not displayed.   Radiology/Studies: Dg Chest 2 View 07/22/2013   CLINICAL DATA:  73 year old male altered mental status and shortness of Breath. Initial encounter.  EXAM: CHEST  2 VIEW  COMPARISON:  06/18/2013 and earlier.  FINDINGS: Stable lung volumes. Stable cardiomegaly and mediastinal contours. Stable retrocardiac hypoventilation. No pneumothorax, pulmonary edema or pleural effusion. No new pulmonary opacity. Stable visualized osseous structures.  IMPRESSION: Stable cardiomegaly. No acute cardiopulmonary abnormality.   Electronically Signed   By: Lars Pinks M.D.   On: 07/22/2013 13:17   Dg Cervical Spine Complete 07/22/2013   CLINICAL DATA:  Right shoulder pain.  Neck pain.  EXAM: CERVICAL SPINE  4+ VIEWS  COMPARISON:  None.  FINDINGS: No fracture or malalignment is identified. Marked loss of disc space height is seen from C2 to C7. There is multilevel facet degenerative change. Autologous fusion anteriorly is seen at C4-5. Prevertebral soft tissues appear normal. Lung apices are clear.  IMPRESSION: No acute finding.  Marked multilevel degenerative disease.   Electronically Signed   By: Inge Rise M.D.   On: 07/22/2013 21:22   Dg Shoulder Right 07/22/2013   CLINICAL DATA:  Right shoulder pain.  EXAM: RIGHT SHOULDER - 2+ VIEW  COMPARISON:   None.  FINDINGS: There is no fracture or dislocation. Mild acromioclavicular degenerative change is noted. Image right lung and ribs are unremarkable.  IMPRESSION: No acute finding.  Mild acromioclavicular degenerative disease.   Electronically Signed   By: Inge Rise M.D.   On: 07/22/2013 21:23   FIE:PPIRJ brady, rate 52, 1st degree AV block (PR 224), IVCD (QRS 130)  TELEMETRY: sinus rhythm with periods of atrial tach with aberration, NSVT  Assessment and Plan:  The patient has chronic systolic dysfunction with EF 10-15% chronically and NYHA Class III CHF.  He is surprisingly compensated for his EF.  He has been followed by Dr Ron Parker for a long time and is on the appropriate medicines.  He has been advised to have an ischemic eval previously but has declined. EP is asked to see for ICD assessment.  The patient does have NSVT but requires ischemic evaluation before we can consider an ICD.  The patient says that he is now more amenable to having a cath. Risks, benefits, and alternatives to right and left heart catheterization were discussed at length with the patient and his spouse and he  wishes to proceed. We will schedule elective outpatient cath next week. I think that given his severely reduced EF, it would be beneficial to have him evaluated electively by the advanced CHF team also.  I will try to coordinate this at the time of his cath. No further inpatient EP workup is planned. His cath is scheduled for Tuesday 07/27/13 at 7:30 am.  He should arrive at 6:30 am to short stay.  NPO after midnight on Monday.

## 2013-07-28 LAB — CBC
HEMATOCRIT: 46.9 % (ref 39.0–52.0)
Hemoglobin: 16.5 g/dL (ref 13.0–17.0)
MCH: 29.9 pg (ref 26.0–34.0)
MCHC: 35.2 g/dL (ref 30.0–36.0)
MCV: 85 fL (ref 78.0–100.0)
PLATELETS: 170 10*3/uL (ref 150–400)
RBC: 5.52 MIL/uL (ref 4.22–5.81)
RDW: 16.6 % — ABNORMAL HIGH (ref 11.5–15.5)
WBC: 5 10*3/uL (ref 4.0–10.5)

## 2013-07-28 LAB — BASIC METABOLIC PANEL
BUN: 20 mg/dL (ref 6–23)
CHLORIDE: 104 meq/L (ref 96–112)
CO2: 29 meq/L (ref 19–32)
Calcium: 9.1 mg/dL (ref 8.4–10.5)
Creatinine, Ser: 1.02 mg/dL (ref 0.50–1.35)
GFR calc Af Amer: 82 mL/min — ABNORMAL LOW (ref >90)
GFR calc non Af Amer: 71 mL/min — ABNORMAL LOW (ref >90)
Glucose, Bld: 100 mg/dL — ABNORMAL HIGH (ref 70–99)
Potassium: 3.8 mEq/L (ref 3.7–5.3)
SODIUM: 145 meq/L (ref 137–147)

## 2013-07-28 LAB — CARBOXYHEMOGLOBIN
Carboxyhemoglobin: 1.8 % — ABNORMAL HIGH (ref 0.5–1.5)
Methemoglobin: 1.5 % (ref 0.0–1.5)
O2 SAT: 55.1 %
Total hemoglobin: 16.5 g/dL (ref 13.5–18.0)

## 2013-07-28 MED ORDER — LISINOPRIL 20 MG PO TABS
20.0000 mg | ORAL_TABLET | Freq: Every day | ORAL | Status: DC
  Administered 2013-07-28: 20 mg via ORAL
  Filled 2013-07-28: qty 1

## 2013-07-28 MED ORDER — LISINOPRIL 20 MG PO TABS
20.0000 mg | ORAL_TABLET | Freq: Two times a day (BID) | ORAL | Status: DC
  Administered 2013-07-28 – 2013-07-29 (×2): 20 mg via ORAL
  Filled 2013-07-28 (×3): qty 1

## 2013-07-28 MED ORDER — FUROSEMIDE 80 MG PO TABS
80.0000 mg | ORAL_TABLET | Freq: Two times a day (BID) | ORAL | Status: DC
  Administered 2013-07-28 – 2013-07-29 (×3): 80 mg via ORAL
  Filled 2013-07-28 (×5): qty 1

## 2013-07-28 MED ORDER — ENOXAPARIN SODIUM 40 MG/0.4ML ~~LOC~~ SOLN
40.0000 mg | SUBCUTANEOUS | Status: DC
  Administered 2013-07-28: 40 mg via SUBCUTANEOUS
  Filled 2013-07-28 (×2): qty 0.4

## 2013-07-28 NOTE — Progress Notes (Signed)
CARDIAC REHAB PHASE I   PRE:  Rate/Rhythm: 2 SR with frequent PVC's  BP:  Supine:   Sitting: 105/76  Standing:    SaO2: 97 RA  MODE:  Ambulation: 350 ft   POST:  Rate/Rhythm: 67 Sr with PVC's  BP:  Supine:   Sitting: 110/72  Standing:    SaO2: 96 RA 1127-1204 Assisted X 1 to ambulate. Gait steady without assistive devices, slow pace. He was able to walk 350 feet only c/o was of arm pain. This was something that he was having a issue wih RA sat after walk 96%. Pt to recliner after walk with call light in reach and family present.   Rodney Langton RN 07/28/2013 12:02 PM

## 2013-07-28 NOTE — Progress Notes (Signed)
Thank you for consulting the Palliative Medicine Team at [facility] to meet your patient's and family's needs.   The reason that you asked [place] to see your patient is for [place] and options.   We have scheduled your patient for a meeting: 2/12 9am  The Surrogate decision make is: To be determined. [name] to make own decisions for now.   Other family members that need to be present:  Ples Specter at patient's request.     Your patient is able/unable to participate: able  Additional Narrative: I spoke with Mr. Grand briefly and he confirms desire for DNR. Order placed.   Vinie Sill, NP Palliative Medicine Team Pager # [phone] Team Phone # [phone]

## 2013-07-28 NOTE — Progress Notes (Addendum)
Advanced Heart Failure Rounding Note   Subjective:   [name] is a 73 year old with history of cardiomyopathy, chronic systolic heart failure dating back to 2009 with recent EF % 5  07/2013, prior stroke, gout, and polycythemia  Admitted yesterday from cath lab due to elevated volume status and low output. Admit pro bnp 17,069.  PICC placed. CO-OX 78% . Diuresed with IV lasix. B-blocker cut back. Weight down 3 pounds.  Denies SOB/Orthopnea. CVP now 3.    RHC/LHC 07/27/13  RA = 9  RV = 65/7/12  PA = 76/23 (49)  PCW = 34  Fick cardiac output/index = 3.5/1.9  Thermo CO/CI = 3.8/2.0  PVR = 4.5 WU  SVR = 2000  FA sat = 85% (checked twice)  PA sat = 51%, 54%  Normal Cors.       Objective:   Weight Range:  Vital Signs:   Temp:  [97.3 F (36.3 C)-98.1 F (36.7 C)] 97.8 F (36.6 C) (02/11 0414) Pulse Rate:  [33-127] 62 (02/11 0450) Resp:  [15-25] 18 (02/11 0450) BP: (94-155)/(63-103) 155/75 mmHg (02/11 0450) SpO2:  [92 %-98 %] 94 % (02/11 0450) Weight:  [157 lb 13.6 oz (71.6 kg)-160 lb 11.5 oz (72.9 kg)] 157 lb 13.6 oz (71.6 kg) (02/11 0414) Last BM Date: 07/27/13  Weight change: Filed Weights   07/27/13 0632 07/27/13 1519 07/28/13 0414  Weight: 156 lb (70.761 kg) 160 lb 11.5 oz (72.9 kg) 157 lb 13.6 oz (71.6 kg)    Intake/Output:   Intake/Output Summary (Last 24 hours) at 07/28/13 0729 Last data filed at 07/28/13 0500  Gross per 24 hour  Intake    440 ml  Output   2000 ml  Net  -1560 ml     Physical Exam:CVP ~3 General: Well appearing. No respiratory difficulty Lying in bed.  HEENT: normal  Neck: supple. JVP flat  Carotids 2+ bilat; no bruits. No lymphadenopathy or thryomegaly appreciated.  Cor: PMI nondisplaced. Regular rate & rhythm. No rubs, or Murmurs. Lungs: clear  Abdomen: soft, nontender, nondistended. No hepatosplenomegaly. No bruits or masses. Good bowel sounds.  Extremities: no cyanosis, clubbing, rash, edema  RUE PICC  Neuro: alert & oriented x 3,  cranial nerves grossly intact. moves all 4 extremities w/o difficulty. Affect pleasant.  Telemetry: SR 60s   Labs: Basic Metabolic Panel:  Recent Labs Lab 07/22/13 1205 07/22/13 1318 07/22/13 1525 07/22/13 1957 07/23/13 0255 07/23/13 0527 07/27/13 1600 07/28/13 0409  NA 142 139 142  --  140  --  141 145  K 6.5* 5.9* 4.0  --  4.1 4.0 3.7 3.8  CL 103 102 101  --  100  --  99 104  CO2 27  --   --   --  28  --  28 29  GLUCOSE 94 87 154*  --  83  --  148* 100*  BUN 22 34* 20  --  17  --  19 20  CREATININE 0.96 1.10 0.90 1.00 0.98  --  1.02 1.02  CALCIUM 9.0  --   --   --  9.0  --  9.3 9.1  MG  --   --   --   --  2.1  --   --   --   PHOS  --   --   --   --  3.6  --   --   --     Liver Function Tests:  Recent Labs Lab 07/22/13 1205  AST 57*  ALT 39  ALKPHOS 65  BILITOT 0.6  PROT 7.6  ALBUMIN 3.4*   No results found for this basename: LIPASE, AMYLASE,  in the last 168 hours No results found for this basename: AMMONIA,  in the last 168 hours  CBC:  Recent Labs Lab 07/22/13 1205  07/22/13 1525 07/22/13 1957 07/23/13 0255 07/27/13 1600 07/28/13 0409  WBC 3.8*  --   --  3.9* 4.7 3.8* 5.0  NEUTROABS 1.9  --   --   --   --   --   --   HGB 17.5*  < > 19.0* 18.0* 16.5 17.0 16.5  HCT 50.2  < > 56.0* 52.7* 48.4 48.7 46.9  MCV 85.4  --   --  86.5 85.5 84.4 85.0  PLT 152  --   --  148* 157 161 170  < > = values in this interval not displayed.  Cardiac Enzymes: No results found for this basename: CKTOTAL, CKMB, CKMBINDEX, TROPONINI,  in the last 168 hours  BNP: BNP (last 3 results)  Recent Labs  06/18/13 1102 07/22/13 1205 07/27/13 1600  PROBNP 3019.0* 8107.0* 17069.0*     Other results:  :   Imaging:  No results found.   Medications:     Scheduled Medications: . aspirin EC  81 mg Oral Daily  . atorvastatin  80 mg Oral q1800  . carvedilol  12.5 mg Oral BID WC  . digoxin  0.125 mg Oral Daily  . enoxaparin (LOVENOX) injection  30 mg Subcutaneous  Q24H  . furosemide  80 mg Intravenous BID  . lisinopril  5 mg Oral Daily  . sodium chloride  10-40 mL Intracatheter Q12H  . sodium chloride  3 mL Intravenous Q12H     Infusions:     PRN Medications:  sodium chloride, acetaminophen, ondansetron (ZOFRAN) IV, sodium chloride, sodium chloride, traMADol   Assessment:  1. A/C Systolic Heart Failure EF 5%  2. NICM ---> cath 07/27/13  3. NSVT  4. Gout   Plan/Discussion:    Admitted post cath for HF tune up. CO-OX 78% will repeat this am. CVP ~ 5-6. Weight down 3 pounds. Stop IV lasix and transition to po  lasix 80 mg twice a day. Continue carvedilol 12.5 mg twice a day. Increase lisinopril  20 mg daily. Renal function stable.   Consult cardiac rehab. Anticipate D/C in am with [facility] to resume services.    Length of Stay: 1  CLEGG,AMY NP-C  07/28/2013, 7:29 AM  Advanced Heart Failure Team Pager [phone] (M-F; [place])  Please contact [facility] for night-coverage after hours (4p -7a ) and weekends on [email or web address]  Patient seen and examined with Darrick Grinder, NP. We discussed all aspects of the encounter. I agree with the assessment and plan as stated above.   I had a long talk with [name] this morning about his situation and the options for advanced HF. He is clear that he is not interested in home inotropes or VAD support. We discussed that his mortality is likely 50% at 4-6 months but he could easily live longer. He is amenable to Palliative Care consult. I d/w Drs. Sherol Dade.  For now will cut b-blocker and see how he does. Aggressively titrate afterload reduction as BP tolerates given markedly elevated SVR. Resume fosinopril 40. Hopefully can add hydralazine/nitrates soon. Will switchback to po lasix. I must say, I am surprised by the fact that he achieved euvolemia after diuresing only 3 pounds with an initial  PCWP 34.  [place] Lemaster,MD 10:57 AM

## 2013-07-29 DIAGNOSIS — Z515 Encounter for palliative care: Secondary | ICD-10-CM

## 2013-07-29 DIAGNOSIS — Z66 Do not resuscitate: Secondary | ICD-10-CM | POA: Diagnosis not present

## 2013-07-29 LAB — CBC
HCT: 52.2 % — ABNORMAL HIGH (ref 39.0–52.0)
Hemoglobin: 17.8 g/dL — ABNORMAL HIGH (ref 13.0–17.0)
MCH: 29.2 pg (ref 26.0–34.0)
MCHC: 34.1 g/dL (ref 30.0–36.0)
MCV: 85.6 fL (ref 78.0–100.0)
PLATELETS: 165 10*3/uL (ref 150–400)
RBC: 6.1 MIL/uL — AB (ref 4.22–5.81)
RDW: 16.6 % — AB (ref 11.5–15.5)
WBC: 5.3 10*3/uL (ref 4.0–10.5)

## 2013-07-29 LAB — BASIC METABOLIC PANEL
BUN: 21 mg/dL (ref 6–23)
CALCIUM: 9.6 mg/dL (ref 8.4–10.5)
CO2: 29 meq/L (ref 19–32)
Chloride: 97 mEq/L (ref 96–112)
Creatinine, Ser: 1.09 mg/dL (ref 0.50–1.35)
GFR calc non Af Amer: 65 mL/min — ABNORMAL LOW (ref >90)
GFR, EST AFRICAN AMERICAN: 76 mL/min — AB (ref >90)
Glucose, Bld: 129 mg/dL — ABNORMAL HIGH (ref 70–99)
Potassium: 4.4 mEq/L (ref 3.7–5.3)
SODIUM: 141 meq/L (ref 137–147)

## 2013-07-29 LAB — CARBOXYHEMOGLOBIN
CARBOXYHEMOGLOBIN: 1.5 % (ref 0.5–1.5)
METHEMOGLOBIN: 0.6 % (ref 0.0–1.5)
O2 Saturation: 72.3 %
TOTAL HEMOGLOBIN: 16.6 g/dL (ref 13.5–18.0)

## 2013-07-29 MED ORDER — ISOSORBIDE MONONITRATE 15 MG HALF TABLET
15.0000 mg | ORAL_TABLET | Freq: Every day | ORAL | Status: DC
  Administered 2013-07-29: 15 mg via ORAL
  Filled 2013-07-29: qty 1

## 2013-07-29 MED ORDER — VITAMIN B-1 100 MG PO TABS
100.0000 mg | ORAL_TABLET | Freq: Every day | ORAL | Status: DC
  Administered 2013-07-29: 100 mg via ORAL
  Filled 2013-07-29: qty 1

## 2013-07-29 MED ORDER — THIAMINE HCL 100 MG/ML IJ SOLN
100.0000 mg | Freq: Every day | INTRAMUSCULAR | Status: DC
  Filled 2013-07-29: qty 1

## 2013-07-29 MED ORDER — HYDRALAZINE HCL 25 MG PO TABS
12.5000 mg | ORAL_TABLET | Freq: Three times a day (TID) | ORAL | Status: DC
  Administered 2013-07-29: 12.5 mg via ORAL
  Filled 2013-07-29 (×3): qty 0.5

## 2013-07-29 MED ORDER — FUROSEMIDE 10 MG/ML IJ SOLN: 40.0000 mg | Freq: Once | INTRAMUSCULAR | Status: DC

## 2013-07-29 MED ORDER — ADULT MULTIVITAMIN W/MINERALS CH
1.0000 | ORAL_TABLET | Freq: Every day | ORAL | Status: DC
  Administered 2013-07-29: 1 via ORAL
  Filled 2013-07-29: qty 1

## 2013-07-29 MED ORDER — HYDRALAZINE HCL 25 MG PO TABS: 12.5000 mg | ORAL_TABLET | Freq: Three times a day (TID) | ORAL | Status: DC

## 2013-07-29 MED ORDER — METOLAZONE 2.5 MG PO TABS: 2.5000 mg | ORAL_TABLET | ORAL | Status: DC | PRN

## 2013-07-29 MED ORDER — LORAZEPAM 1 MG PO TABS: 0.0000 mg | ORAL_TABLET | Freq: Two times a day (BID) | ORAL | Status: DC

## 2013-07-29 MED ORDER — LORAZEPAM 2 MG/ML IJ SOLN: 1.0000 mg | Freq: Four times a day (QID) | INTRAMUSCULAR | Status: DC | PRN

## 2013-07-29 MED ORDER — ISOSORBIDE MONONITRATE 15 MG HALF TABLET: 15.0000 mg | ORAL_TABLET | Freq: Every day | ORAL | Status: DC

## 2013-07-29 MED ORDER — DIGOXIN 125 MCG PO TABS: 0.1250 mg | ORAL_TABLET | Freq: Every day | ORAL | Status: DC

## 2013-07-29 MED ORDER — LORAZEPAM 1 MG PO TABS
0.0000 mg | ORAL_TABLET | Freq: Four times a day (QID) | ORAL | Status: DC
  Administered 2013-07-29: 1 mg via ORAL
  Filled 2013-07-29: qty 2

## 2013-07-29 MED ORDER — CARVEDILOL 12.5 MG PO TABS: 12.5000 mg | ORAL_TABLET | Freq: Two times a day (BID) | ORAL | Status: DC

## 2013-07-29 MED ORDER — FOLIC ACID 1 MG PO TABS
1.0000 mg | ORAL_TABLET | Freq: Every day | ORAL | Status: DC
  Administered 2013-07-29: 1 mg via ORAL
  Filled 2013-07-29: qty 1

## 2013-07-29 MED ORDER — LORAZEPAM 1 MG PO TABS: 1.0000 mg | ORAL_TABLET | Freq: Four times a day (QID) | ORAL | Status: DC | PRN

## 2013-07-29 NOTE — Progress Notes (Addendum)
Notified by [facility] pt/family are requesting services of Hospice and [facility]) after discharge; spoke briefly with patient at bedside and then significant other Ples Specter; both confirmed plan is for discharge today and are agreeable to Many visit tomorrow, Friday 07/30/13 at 10 am;  Probation officer contacted Milderd Meager [employer] to inform of DME request for -Complete Package D: fully electric hospital bed with AP&P mattress , half rails and over-bed table- [facility] to notify [facility] representative - *Please contact Ples Specter [phone] to arrange delivery time Per discussion plan is for pt to transport by personal vehicle to home [place] stated she plans to take pt home by her car  Information sent to [facility]  **Please send completed GOLD DNR Form home with pt  Please call with any questions Danton Sewer, RN  [phone]

## 2013-07-29 NOTE — Progress Notes (Signed)
Dr Aundra Dubin made aware about the incident earlier.

## 2013-07-29 NOTE — Progress Notes (Signed)
Friend at the bedside-pt more calm-Picc line flushed/saline lock and tele box placed to keep pt on the monitor.

## 2013-07-29 NOTE — Progress Notes (Addendum)
Pt was fd.up oob picc line disconnected/naked pulled gown and monitor,denied being confused.

## 2013-07-29 NOTE — Discharge Instructions (Signed)
Heart Failure Heart failure means your heart has trouble pumping blood. This makes it hard for your body to work well. Heart failure is usually a long-term (chronic) condition. You must take good care of yourself and follow your doctor's treatment plan. HOME CARE  Take your heart medicine as told by your doctor.  Do not stop taking medicine unless your doctor tells you to.  Do not skip any dose of medicine.  Refill your medicines before they run out.  Take other medicines only as told by your doctor or pharmacist.  Stay active if told by your doctor. The elderly and people with severe heart failure should talk with a doctor about physical activity.  Eat heart healthy foods. Choose foods that are without trans fat and are low in saturated fat, cholesterol, and salt (sodium). This includes fresh or frozen fruits and vegetables, fish, lean meats, fat-free or low-fat dairy foods, whole grains, and high-fiber foods. Lentils and dried peas and beans (legumes) are also good choices.  Limit salt if told by your doctor.  Cook in a healthy way. Roast, grill, broil, bake, poach, steam, or stir-fry foods.  Limit fluids as told by your doctor.  Weigh yourself every morning. Do this after you pee (urinate) and before you eat breakfast. Write down your weight to give to your doctor.  Take your blood pressure and write it down if your doctor tell you to.  Ask your doctor how to check your pulse. Check your pulse as told.  Lose weight if told by your doctor.  Stop smoking or chewing tobacco. Do not use gum or patches that help you quit without your doctor's approval.  Schedule and go to doctor visits as told.  Nonpregnant women should have no more than 1 drink a day. Men should have no more than 2 drinks a day. Talk to your doctor about drinking alcohol.  Stop illegal drug use.  Stay current with shots (immunizations).  Manage your health conditions as told by your doctor.  Learn to manage  your stress.  Rest when you are tired.  If it is really hot outside:  Avoid intense activities.  Use air conditioning or fans, or get in a cooler place.  Avoid caffeine and alcohol.  Wear loose-fitting, lightweight, and light-colored clothing.  If it is really cold outside:  Avoid intense activities.  Layer your clothing.  Wear mittens or gloves, a hat, and a scarf when going outside.  Avoid alcohol.  Learn about heart failure and get support as needed.  Get help to maintain or improve your quality of life and your ability to care for yourself as needed. GET HELP IF:   You gain 03 lb/1.4 kg or more in 1 day or 05 lb/2.3 kg in a week.  You are more short of breath than usual.  You cannot do your normal activities.  You tire easily.  You cough more than normal, especially with activity.  You have any or more puffiness (swelling) in areas such as your hands, feet, ankles, or belly (abdomen).  You cannot sleep because it is hard to breathe.  You feel like your heart is beating fast (palpitations).  You get dizzy or lightheaded when you stand up. GET HELP RIGHT AWAY IF:   You have trouble breathing.  There is a change in mental status, such as becoming less alert or not being able to focus.  You have chest pain or discomfort.  You faint. MAKE SURE YOU:   Understand these   instructions.  Will watch your condition.  Will get help right away if you are not doing well or get worse. Document Released: 03/12/2008 Document Revised: 09/28/2012 Document Reviewed: 01/02/2012 Exit[employer] Patient Information 2014 [employer].  

## 2013-07-29 NOTE — Discharge Summary (Signed)
Advanced Heart Failure Team  Discharge Summary   Patient ID: [name]. MRN: [id number], DOB/AGE: 08-14-1940 73 y.o. Admit date: 07/27/2013 D/C date:     07/29/2013   Primary Discharge Diagnoses:  1. A/C Systolic Heart Failure EF 5%  2. NICM ---> cath 07/27/13  3. NSVT  4. Gout  5. DNR/DNI  6. Delirium, acute  7. ETOH abuse 8. [facility] Course:  [name] is a 73 year old with history of cardiomyopathy, chronic systolic heart failure dating back to 2009 with recent EF 5% 07/2013, prior stroke, gout, and polycythemia. Drinks 1 pint of alcohol per day.   He was admitted from the cath lab due to elevated filling pressures and suspected low output heart failure. PICC line was placed for CVP and CO-OX. Initial CO-OX was 78% so Milrinone was not started. He was diuresed with IV lasix and as his volume status improved he was transitioned to po lasix. Beta blocker dose has been cut in half and digoxin was added. He remained on goal dose Ace and low dose hydralazine/imdur have been added. Metolazone is also being added as needed for 3-5 pound weight gain or increased dyspnea.   Prior to discharge he was was agitated and there was concern for withdrawal versus ICU psychosis given the fact that he drinks 1 pint of alcohol per day.  Treated with CIW protocol.  Palliative Care consulted for goals of care and elected to become DNR/DNI. He is not interested in heart failure advanced therapies and agreed to [place] at discharge. He will continue to be followed in the HF clinic and has follow up scheduled 08/05/12 at 1:00. Will plan to check BMET and dig level at his follow up.      RHC/LHC 07/27/13  RA = 9  RV = 65/7/12  PA = 76/23 (49)  PCW = 34  Fick cardiac output/index = 3.5/1.9  Thermo CO/CI = 3.8/2.0  PVR = 4.5 WU  SVR = 2000  FA sat = 85% (checked twice)  PA sat = 51%, 54%  Normal Cors.        Discharge Weight Range: 158  pounds Discharge Vitals: Blood pressure 143/99, pulse 80, temperature 97.3 F (36.3 C), temperature source Oral, resp. rate 22, height 5\' 11"  (1.803 m), weight 158 lb 8.2 oz (71.9 kg), SpO2 94.00%.  Labs: Lab Results  Component Value Date   WBC 5.3 07/29/2013   HGB 17.8* 07/29/2013   HCT 52.2* 07/29/2013   MCV 85.6 07/29/2013   PLT 165 07/29/2013    Recent Labs Lab 07/22/13 1205  07/29/13 0228  NA 142  < > 141  K 6.5*  < > 4.4  CL 103  < > 97  CO2 27  < > 29  BUN 22  < > 21  CREATININE 0.96  < > 1.09  CALCIUM 9.0  < > 9.6  PROT 7.6  --   --   BILITOT 0.6  --   --   ALKPHOS 65  --   --   ALT 39  --   --   AST 57*  --   --   GLUCOSE 94  < > 129*  < > = values in this interval not displayed. Lab Results  Component Value Date   CHOL  Value: 117        ATP III CLASSIFICATION:  <200     mg/dL   Desirable  200-239  mg/dL  Borderline High  >=240    mg/dL   High 04/25/2008   HDL 38* 04/25/2008   LDLCALC  Value: 69        Total Cholesterol/HDL:CHD Risk Coronary Heart Disease Risk Table                     Men   Women  1/2 Average Risk   3.4   3.3 04/25/2008   TRIG 52 04/25/2008   BNP (last 3 results)  Recent Labs  06/18/13 1102 07/22/13 1205 07/27/13 1600  PROBNP 3019.0* 8107.0* 17069.0*    Diagnostic Studies/Procedures   No results found.  Discharge Medications     Medication List         aspirin 81 MG tablet  Take 81 mg by mouth daily.     carvedilol 12.5 MG tablet  Commonly known as:  COREG  Take 1 tablet (12.5 mg total) by mouth 2 (two) times daily with a meal.     colchicine 0.6 MG tablet  Take 0.6 mg by mouth every 8 (eight) hours as needed (for gout flares).     digoxin 0.125 MG tablet  Commonly known as:  LANOXIN  Take 1 tablet (0.125 mg total) by mouth daily.     fluticasone 50 MCG/ACT nasal spray  Commonly known as:  FLONASE  Place 2 sprays into the nose at bedtime.     fosinopril 40 MG tablet  Commonly known as:  MONOPRIL  Take 40 mg by mouth  daily.     furosemide 80 MG tablet  Commonly known as:  LASIX  Take 1 tablet (80 mg total) by mouth 2 (two) times daily.     gabapentin 100 MG capsule  Commonly known as:  NEURONTIN  Take 1 capsule (100 mg total) by mouth 2 (two) times daily.     hydrALAZINE 25 MG tablet  Commonly known as:  APRESOLINE  Take 0.5 tablets (12.5 mg total) by mouth every 8 (eight) hours.     isosorbide mononitrate 15 mg Tb24 24 hr tablet  Commonly known as:  IMDUR  Take 0.5 tablets (15 mg total) by mouth daily.     metolazone 2.5 MG tablet  Commonly known as:  ZAROXOLYN  Take 1 tablet (2.5 mg total) by mouth as needed. As needed for dyspnea     nitroGLYCERIN 0.4 MG SL tablet  Commonly known as:  NITROSTAT  Place 1 tablet (0.4 mg total) under the tongue every 5 (five) minutes as needed for chest pain.     rosuvastatin 40 MG tablet  Commonly known as:  CRESTOR  Take 40 mg by mouth daily.     traMADol 50 MG tablet  Commonly known as:  ULTRAM  Take 1 tablet (50 mg total) by mouth every 8 (eight) hours as needed for moderate pain.        Disposition   The patient will be discharged in stable condition to home.     Discharge Orders   Future Orders Complete By Expires   ACE Inhibitor / ARB already ordered  As directed    Diet - low sodium heart healthy  As directed    Heart Failure patients record your daily weight using the same scale at the same time of day  As directed    Increase activity slowly  As directed      Follow-up Information   Follow up with [facility] On 08/05/2013. (at 1:00 Garage Code [id number] )  Specialty:  Cardiology   Contact information:   [id number] [place] [address] [phone]        Duration of Discharge Encounter: Greater than 35 minutes   Signed, CLEGG,AMY NP-C  07/29/2013, 10:23 AM  Patient seen and examined with Darrick Grinder, NP. We discussed all aspects of the encounter. I agree  with the assessment and plan as stated above.   I have edited the note to reflect my changes. He is stable for d/c with Hospice. Will follow in HF Clinic.   Benay Spice 6:35 PM

## 2013-07-29 NOTE — Consult Note (Signed)
Patient [name].      DOB: 02/18/1941      CZY:[id number]     Consult Note from the Palliative Medicine Team at [facility] Requested by: Paul Grinder, NP     PCP: Paul Rouse, MD Reason for Consultation: [name] and options     Phone Number:[phone]  Assessment of patients Current state: [name] is a 73 yo male with systolic heart failure (EF 10%) and beginning to have signs of alcohol withdrawal. I spoke with [name] and his friend [name] [phone]) who he wished to be part of this meeting. He does not have official HCPOA and does have a daughter who he is not close with. [name] talks of considering [name] to be his HCPOA, so I gave them the paperwork and asked him to let the nurse know to call the CSW if he decides to complete HCPOA. We discussed his natural disease trajectory in relation to his heart disease and his goals for his care. [name] confirms DNR with the understanding of the poor function of his heart and says that he does not want to be a burden on anyone. He wishes to go home and we discuss the safety at his home as [name] tells me that he has falls frequently and that he has stairs in his home. [name] helps him at home but is unable to be there 24/7. We discussed getting help from hospice and getting a hospital bed placed downstairs so that he does not have to go up and down the stairs (only to bathe which hospice can help him get upstairs when they are there). [name] discusses finding a one level house where she can stay with him and help him more time but their relationship seems strained at the current time and this option seems uncertain. They both are agreeable to having hospice come in to help out. We did complete MOST form with DNR, limited interventions, consider antibiotic use, and no IVF and no feeding tube.   We also briefly discussed his alcohol use, which he denied. I confirmed his current symptoms are in line with what we see in alcohol  withdrawal but he evades discussion. [name] later tells me that other people bring him alcohol and that she is unsure on how much he actually consumes. He was denying alcohol use to me so I do not believe I could pursue his life decisions and I discussed this with [name]. The plan now is for him to go home with hospice support. I discussed with Paul Grinder, NP and Dr. Haroldine Potts.   Discussed his pain which he describes as beginning in his shoulder and shooting pain down his left arm into his 4th and 5th fingers that tingle. We discussed the possibility of pain coming from his cervical neck that could have muscle inflammation causing nerve pain that could be caused/exacerbated by his falls. He is to follow up with his PCP Dr. Lutricia Potts on this pain.    Goals of Care: 1.  Code Status: DNR   2. Scope of Treatment: Patient is refusing therapies such as telemetry but this is mainly due to his assumed ETOH withdrawal and agitation.    4. Disposition: Home with hospice.    3. Symptom Management:   1. Anxiety/Agitation: Ativan with CIWA. 2. Pain: Tramadol prn. Follow up with Dr. Brigitte Potts for pain management.  3. Weakness: Continue medical management. Consider home PT.   4. Psychosocial: Emotional support to  [name] and his friend [name].   Patient Documents Completed or Given: Document Given Completed  Advanced Directives Pkt    MOST  yes  DNR  yes  Gone from My Sight    Hard Choices      Brief HPI: 73 yo male with systolic heart failure.    ROS: + pain    PMH:  Past Medical History  Diagnosis Date  . Pneumonia 11/01  . Cardiomyopathy     refused cath and refused icd/echo.Marland KitchenMarland Kitchen6/2009 10%  LV .Marland Kitchen.62mm+  . Volume overload   . H/O: drug dependency     In the past., clean for greater than 20 years  . Ejection fraction < 50%     EF 10%, echo, June, 2009, LV 75 mm  . Mitral regurgitation     Mild by history  . Right ventricular dysfunction     Mild to moderate, echo, June, 2009  .  Tobacco abuse   . Systolic CHF, chronic     Patient is refusing ICD over the years  . Substance abuse   . Stroke   . Polycythemia     Jak 2 Negative  . Shortness of breath   . Dysrhythmia 07/2013    PVT  . Hyperkalemia 07/2013     PSH: Past Surgical History  Procedure Laterality Date  . Colonoscopy    . Ingunial hernia repair x2    . Hernia repair     I have reviewed the FH and SH and  If appropriate update it with new information. No Known Allergies Scheduled Meds: . aspirin EC  81 mg Oral Daily  . atorvastatin  80 mg Oral q1800  . carvedilol  12.5 mg Oral BID WC  . digoxin  0.125 mg Oral Daily  . enoxaparin (LOVENOX) injection  40 mg Subcutaneous Q24H  . folic acid  1 mg Oral Daily  . furosemide  80 mg Oral BID  . hydrALAZINE  12.5 mg Oral 3 times per day  . isosorbide mononitrate  15 mg Oral Daily  . lisinopril  20 mg Oral BID  . LORazepam  0-4 mg Oral Q6H   Followed by  . [START ON 07/31/2013] LORazepam  0-4 mg Oral Q12H  . multivitamin with minerals  1 tablet Oral Daily  . sodium chloride  10-40 mL Intracatheter Q12H  . sodium chloride  3 mL Intravenous Q12H  . thiamine  100 mg Oral Daily   Or  . thiamine  100 mg Intravenous Daily   Continuous Infusions:  PRN Meds:.sodium chloride, acetaminophen, LORazepam, LORazepam, ondansetron (ZOFRAN) IV, sodium chloride, sodium chloride, traMADol    BP 143/99  Potts 80  Temp(Src) 97.3 F (36.3 C) (Oral)  Resp 22  Ht 5\' 11"  (1.803 m)  Wt 71.9 kg (158 lb 8.2 oz)  BMI 22.12 kg/m2  SpO2 94%   PPS: 50%   Intake/Output Summary (Last 24 hours) at 07/29/13 0932 Last data filed at 07/29/13 0200  Gross per 24 hour  Intake    970 ml  Output    850 ml  Net    120 ml   LBM: 07/27/13                       Physical Exam:  General: NAD, anxious, pleasant HEENT: [place]/AT, no JVD, moist mucous membranes Chest: CTA throughout, no labored breathing, symmetric CVS: RRR, S1 S2 Abdomen: Soft, NT, ND, +BS Ext: MAE, trace edema  bilat Neuro: Alert and oriented to  person/place/time/situation, agitated  Labs: CBC    Component Value Date/Time   WBC 5.3 07/29/2013 0228   WBC 4.6 12/04/2007 1315   RBC 6.10* 07/29/2013 0228   RBC 6.41* 12/04/2007 1315   HGB 17.8* 07/29/2013 0228   HGB 18.5* 12/04/2007 1315   HCT 52.2* 07/29/2013 0228   HCT 55.4* 12/04/2007 1315   PLT 165 07/29/2013 0228   PLT 161 12/04/2007 1315   MCV 85.6 07/29/2013 0228   MCV 86.5 12/04/2007 1315   MCH 29.2 07/29/2013 0228   MCH 28.9 12/04/2007 1315   MCHC 34.1 07/29/2013 0228   MCHC 33.4 12/04/2007 1315   RDW 16.6* 07/29/2013 0228   RDW 16.2* 12/04/2007 1315   LYMPHSABS 1.3 07/22/2013 1205   LYMPHSABS 1.5 12/04/2007 1315   MONOABS 0.5 07/22/2013 1205   MONOABS 0.7 12/04/2007 1315   EOSABS 0.0 07/22/2013 1205   EOSABS 0.0 12/04/2007 1315   BASOSABS 0.0 07/22/2013 1205   BASOSABS 0.1 12/04/2007 1315    BMET    Component Value Date/Time   NA 141 07/29/2013 0228   K 4.4 07/29/2013 0228   CL 97 07/29/2013 0228   CO2 29 07/29/2013 0228   GLUCOSE 129* 07/29/2013 0228   BUN 21 07/29/2013 0228   CREATININE 1.09 07/29/2013 0228   CALCIUM 9.6 07/29/2013 0228   GFRNONAA 65* 07/29/2013 0228   GFRAA 76* 07/29/2013 0228    CMP     Component Value Date/Time   NA 141 07/29/2013 0228   K 4.4 07/29/2013 0228   CL 97 07/29/2013 0228   CO2 29 07/29/2013 0228   GLUCOSE 129* 07/29/2013 0228   BUN 21 07/29/2013 0228   CREATININE 1.09 07/29/2013 0228   CALCIUM 9.6 07/29/2013 0228   PROT 7.6 07/22/2013 1205   ALBUMIN 3.4* 07/22/2013 1205   AST 57* 07/22/2013 1205   ALT 39 07/22/2013 1205   ALKPHOS 65 07/22/2013 1205   BILITOT 0.6 07/22/2013 1205   GFRNONAA 65* 07/29/2013 0228   GFRAA 76* 07/29/2013 0228      Time In Time Out Total Time Spent with Patient Total Overall Time  0830 1000 37min 2min    Greater than 50%  of this time was spent counseling and coordinating care related to the above assessment and plan.  Vinie Sill, NP Palliative Medicine Team Pager # [phone] Team  Phone # [phone]

## 2013-07-29 NOTE — Progress Notes (Addendum)
Pt refused tele box or any monitor. Refused assessment

## 2013-07-29 NOTE — Progress Notes (Signed)
   CARE MANAGEMENT NOTE 07/29/2013  Patient:  Skufca,Eduard   Account Number:  [id number]  Date Initiated:  07/29/2013  Documentation initiated by:  [facility]  Subjective/Objective Assessment:     Action/Plan:   lives at home with wife, Kalman Shan [phone]   Anticipated DC Date:  07/29/2013   Anticipated DC Plan:  [place]  CM consult      PAC Choice  HOSPICE   Choice offered to / List presented to:  C-3 Spouse   DME arranged  3-N-1  [place]      DME agency  [place] arranged  HH-10 DISEASE MANAGEMENT      HH agency  [facility] [place]   Status of service:  Completed, signed off Medicare Important Message given?   (If response is "NO", the following Medicare IM given date fields will be blank) Date Medicare IM given:   Date Additional Medicare IM given:    Discharge Disposition:  [place]  Per UR Regulation:    If discussed at Long Length of Stay Meetings, dates discussed:    Comments:  07/29/2013 1030 NCM spoke to pt and gave permission to speak with wife, Kalman Shan. Offered choice for Hospice. Wife requested [facility]. States they will need hospital bed, RW, and 3n1 for home. Will to go home and wait for hospital bed. NCM contacted [place], [place] Liaison. Will arrange DME with [facility] for delivery to home. Jonnie Finner RN CCM Case Mgmt phone [phone]

## 2013-07-29 NOTE — Progress Notes (Addendum)
Advanced Heart Failure Rounding Note   Subjective:   [name] is a 73 year old with history of cardiomyopathy, chronic systolic heart failure dating back to 2009 with recent EF % 5  07/2013, prior stroke, gout, and polycythemia. Drinks 1 pint of alcohol a week.   Admitted  from cath lab due to elevated volume status and low output. Admit pro bnp 17,069.  PICC placed.  Diuresed with IV lasix. B-blocker cut back. Yesterday lisinopril was increased to 20 mg twice a day. Weight up 1 pound.     Over night he was agitated and thought he was tied up but he was tangled up in his PICC line.  Girlfriend was notified and she has been in his room all night. He has removed his gown and dressed in his clothes.   Denies SOB/Orthopnea.     RHC/LHC 07/27/13  RA = 9  RV = 65/7/12  PA = 76/23 (49)  PCW = 34  Fick cardiac output/index = 3.5/1.9  Thermo CO/CI = 3.8/2.0  PVR = 4.5 WU  SVR = 2000  FA sat = 85% (checked twice)  PA sat = 51%, 54%  Normal Cors.    CO-OX 72%    Objective:   Weight Range:  Vital Signs:   Temp:  [97.3 F (36.3 C)-97.8 F (36.6 C)] 97.3 F (36.3 C) (02/12 0431) Pulse Rate:  [37-80] 80 (02/12 0432) Resp:  [13-24] 22 (02/12 0100) BP: (104-143)/(67-110) 143/99 mmHg (02/12 0431) SpO2:  [94 %-98 %] 94 % (02/12 0432) Weight:  [158 lb 8.2 oz (71.9 kg)] 158 lb 8.2 oz (71.9 kg) (02/12 0437) Last BM Date: 07/27/13  Weight change: Filed Weights   07/27/13 1519 07/28/13 0414 07/29/13 0437  Weight: 160 lb 11.5 oz (72.9 kg) 157 lb 13.6 oz (71.6 kg) 158 lb 8.2 oz (71.9 kg)    Intake/Output:   Intake/Output Summary (Last 24 hours) at 07/29/13 0705 Last data filed at 07/29/13 0200  Gross per 24 hour  Intake   1370 ml  Output   1125 ml  Net    245 ml     Physical Exam: General: Agitated. Well appearing. No respiratory difficulty Lying in bed. Girlfriend present  HEENT: normal  Neck: supple. JVP flat  Carotids 2+ bilat; no bruits. No lymphadenopathy or thryomegaly  appreciated.  Cor: PMI nondisplaced. Regular rate & rhythm. No rubs, or Murmurs. Lungs: clear  Abdomen: soft, nontender, nondistended. No hepatosplenomegaly. No bruits or masses. Good bowel sounds.  Extremities: no cyanosis, clubbing, rash, edema  RUE PICC  Neuro: alert & oriented x 3, cranial nerves grossly intact. moves all 4 extremities w/o difficulty. Agitated and pacing the room.   Telemetry: SR 60s   Labs: Basic Metabolic Panel:  Recent Labs Lab 07/22/13 1205  07/22/13 1525 07/22/13 1957 07/23/13 0255 07/23/13 0527 07/27/13 1600 07/28/13 0409 07/29/13 0228  NA 142  < > 142  --  140  --  141 145 141  K 6.5*  < > 4.0  --  4.1 4.0 3.7 3.8 4.4  CL 103  < > 101  --  100  --  99 104 97  CO2 27  --   --   --  28  --  _0 GLUCOSE 94  < > 154*  --  83  --  148* 100* 129*  BUN 22  < > 20  --  17  --  _1 CREATININE 0.96  < > 0.90 1.00  0.98  --  1.02 1.02 1.09  CALCIUM 9.0  --   --   --  9.0  --  9.3 9.1 9.6  MG  --   --   --   --  2.1  --   --   --   --   PHOS  --   --   --   --  3.6  --   --   --   --   < > = values in this interval not displayed.  Liver Function Tests:  Recent Labs Lab 07/22/13 1205  AST 57*  ALT 39  ALKPHOS 65  BILITOT 0.6  PROT 7.6  ALBUMIN 3.4*   No results found for this basename: LIPASE, AMYLASE,  in the last 168 hours No results found for this basename: AMMONIA,  in the last 168 hours  CBC:  Recent Labs Lab 07/22/13 1205  07/22/13 1957 07/23/13 0255 07/27/13 1600 07/28/13 0409 07/29/13 0228  WBC 3.8*  --  3.9* 4.7 3.8* 5.0 5.3  NEUTROABS 1.9  --   --   --   --   --   --   HGB 17.5*  < > 18.0* 16.5 17.0 16.5 17.8*  HCT 50.2  < > 52.7* 48.4 48.7 46.9 52.2*  MCV 85.4  --  86.5 85.5 84.4 85.0 85.6  PLT 152  --  148* 157 161 170 165  < > = values in this interval not displayed.  Cardiac Enzymes: No results found for this basename: CKTOTAL, CKMB, CKMBINDEX, TROPONINI,  in the last 168 hours  BNP: BNP (last 3  results)  Recent Labs  06/18/13 1102 07/22/13 1205 07/27/13 1600  PROBNP 3019.0* 8107.0* 17069.0*     Other results:  :   Imaging: No results found.   Medications:     Scheduled Medications: . aspirin EC  81 mg Oral Daily  . atorvastatin  80 mg Oral q1800  . carvedilol  12.5 mg Oral BID WC  . digoxin  0.125 mg Oral Daily  . enoxaparin (LOVENOX) injection  40 mg Subcutaneous Q24H  . furosemide  80 mg Oral BID  . lisinopril  20 mg Oral BID  . sodium chloride  10-40 mL Intracatheter Q12H  . sodium chloride  3 mL Intravenous Q12H    Infusions:    PRN Medications: sodium chloride, acetaminophen, ondansetron (ZOFRAN) IV, sodium chloride, sodium chloride, traMADol   Assessment:  1. A/C Systolic Heart Failure EF 5%  2. NICM ---> cath 07/27/13  3. NSVT  4. Gout  5. DNR/DNI 6. Delirium, acute 7. ETOH abuse  Plan/Discussion:   Agitated over night. He is oriented but pacing in the room removing gown and getting dressed.  Prior to admit he  Drinking 1 pint of liquor per day but his girlfriend says it could be more.  ?Withdrawl vs ICU psychosis. Start CIWA protocol.   Volume status stable. Continue lasix 80 mg twice a day. CO-OX stable. Unable to check CVP due to agitation. Continue reduced dose of carvedilol 12.5 mg twice a day. Continue lisinopril 20 mg twice a day. Add hydralazine 12.5 mg tid and Imdur 15 mg daily.  Renal function stable.   Palliative Care meeting today at 9:00.   Length of Stay: 2  CLEGG,AMY NP-C  07/29/2013, 7:05 AM  Advanced Heart Failure Team Pager [phone] (M-F; [place])  Please contact [facility] for night-coverage after hours (4p -7a ) and weekends on [email or web address]  Very agitated overnight due to  possible ETOH withdrawal vs ICU psychosis. Agree with CIWA protocol.  Has met with Hospice and wants to be DNR/DNI with Hospice services.   Agree with cutting back b-blocker and adding hydral/NTG. Use metolazone 2.31m prn for volume  overload/dyspnea. Home today if agitation improved.   Daniel Bensimhon,MD 10:13 AM

## 2013-08-05 ENCOUNTER — Other Ambulatory Visit (HOSPITAL_COMMUNITY): Payer: Self-pay

## 2013-08-05 ENCOUNTER — Ambulatory Visit (HOSPITAL_COMMUNITY)
Admission: RE | Admit: 2013-08-05 | Discharge: 2013-08-05 | Disposition: A | Discharge status: Home / Self Care | Payer: Medicare PPO | Source: Ambulatory Visit | Attending: Internal Medicine | Admitting: Internal Medicine

## 2013-08-05 VITALS — BP 105/64 | HR 47 | Wt 162.0 lb

## 2013-08-05 DIAGNOSIS — I5022 Chronic systolic (congestive) heart failure: Secondary | ICD-10-CM

## 2013-08-05 DIAGNOSIS — I509 Heart failure, unspecified: Secondary | ICD-10-CM

## 2013-08-05 DIAGNOSIS — M109 Gout, unspecified: Secondary | ICD-10-CM | POA: Insufficient documentation

## 2013-08-05 DIAGNOSIS — Z66 Do not resuscitate: Secondary | ICD-10-CM

## 2013-08-05 DIAGNOSIS — F1010 Alcohol abuse, uncomplicated: Secondary | ICD-10-CM | POA: Insufficient documentation

## 2013-08-05 MED ORDER — ISOSORBIDE MONONITRATE 15 MG HALF TABLET: 15.0000 mg | ORAL_TABLET | Freq: Every day | ORAL | Status: DC

## 2013-08-05 NOTE — Patient Instructions (Addendum)
Follow up in 1 month  Dont take carvedilol this afternoon.   Take Imdur 15 mg daily    Do the following things EVERYDAY: 1) Weigh yourself in the morning before breakfast. Write it down and keep it in a log. 2) Take your medicines as prescribed 3) Eat low salt foods-Limit salt (sodium) to 2000 mg per day.  4) Stay as active as you can everyday 5) Limit all fluids for the day to less than 2 liters

## 2013-08-05 NOTE — Progress Notes (Signed)
Patient ID: [name]., male   DOB: 05/30/1941, 73 y.o.   MRN: [id number]  Weight Range  158--160 pounds   Baseline proBNP   [facility] - DNR/DNI PCP: Dr [name]  HPI: [name] is a 73 year old with history of cardiomyopathy, chronic systolic heart failure dating back to 2009 with recent EF 5% 07/2013, prior stroke, gout, and polycythemia. Drinks 1 pint of alcohol per day.   Admitted to [facility] 07/27/13  with increased dyspnea. He was admitted post cath due to elevated filling pressures. PICC was placed and initila CO-OX was 78%. Prior to discharge he was agitates and there was a concern for withdrawal. Palliative Care met with him and he elected to become DNR/DNI.   He returns for post hospital follow up. He was evaluated by Hospice and placed on cipro and flomax for UTI. Overall says he is feeling better. Denies SOB/PND/Orthopnea. He is not currently weighing at home because his scale is not working. Followed by [name]. He lives alone but has a girlfriend that helps him as needed.  He brought all medications to his appointment and after review he had a 12.5 mg bottle of carvedilol and a 25 mg bottle of carvedilol. He has been taking them both. He has not had any alcohol. Drinking g< 2 liters per day. Following low salt diet.   RHC/LHC 07/27/13  RA = 9  RV = 65/7/12  PA = 76/23 (49)  PCW = 34  Fick cardiac output/index = 3.5/1.9  Thermo CO/CI = 3.8/2.0  PVR = 4.5 WU  SVR = 2000  FA sat = 85% (checked twice)  PA sat = 51%, 54%  Normal Cors.      ROS: All systems negative except as listed in HPI, PMH and Problem List.  Past Medical History  Diagnosis Date  . Pneumonia 11/01  . Cardiomyopathy     refused cath and refused icd/echo.Marland KitchenMarland Kitchen6/2009 10%  LV .Marland Kitchen.51m+  . Volume overload   . H/O: drug dependency     In the past., clean for greater than 20 years  . Ejection fraction < 50%     EF 10%, echo, June, 2009, LV 75 mm  . Mitral regurgitation     Mild by history   . Right ventricular dysfunction     Mild to moderate, echo, June, 2009  . Tobacco abuse   . Systolic CHF, chronic     Patient is refusing ICD over the years  . Substance abuse   . Stroke   . Polycythemia     Jak 2 Negative  . Shortness of breath   . Dysrhythmia 07/2013    PVT  . Hyperkalemia 07/2013    Current Outpatient Prescriptions  Medication Sig Dispense Refill  . aspirin 81 MG tablet Take 81 mg by mouth daily.        . carvedilol (COREG) 12.5 MG tablet Take 25 mg by mouth 2 (two) times daily with a meal.      . carvedilol (COREG) 12.5 MG tablet Take 12.5 mg by mouth 2 (two) times daily with a meal.      . ciprofloxacin (CIPRO) 500 MG tablet Take 500 mg by mouth 2 (two) times daily.      . colchicine 0.6 MG tablet Take 0.6 mg by mouth every 8 (eight) hours as needed (for gout flares).      .Marland Kitchendigoxin (LANOXIN) 0.125 MG tablet Take 1 tablet (0.125 mg total) by mouth daily.  30 tablet  6  . fluticasone (FLONASE) 50 MCG/ACT nasal spray Place 2 sprays into the nose at bedtime.       . fosinopril (MONOPRIL) 40 MG tablet Take 40 mg by mouth daily.      . furosemide (LASIX) 80 MG tablet Take 1 tablet (80 mg total) by mouth 2 (two) times daily.  60 tablet  11  . gabapentin (NEURONTIN) 100 MG capsule Take 1 capsule (100 mg total) by mouth 2 (two) times daily.  60 capsule  6  . hydrALAZINE (APRESOLINE) 25 MG tablet Take 0.5 tablets (12.5 mg total) by mouth every 8 (eight) hours.  90 tablet  6  . metolazone (ZAROXOLYN) 2.5 MG tablet Take 1 tablet (2.5 mg total) by mouth as needed. As needed for dyspnea  10 tablet  6  . nitroGLYCERIN (NITROSTAT) 0.4 MG SL tablet Place 1 tablet (0.4 mg total) under the tongue every 5 (five) minutes as needed for chest pain.  25 tablet  3  . rosuvastatin (CRESTOR) 40 MG tablet Take 40 mg by mouth daily.       . tamsulosin (FLOMAX) 0.4 MG CAPS capsule Take 0.4 mg by mouth.      . traMADol (ULTRAM) 50 MG tablet Take 1 tablet (50 mg total) by mouth every 8 (eight)  hours as needed for moderate pain.  90 tablet  3  . isosorbide mononitrate (IMDUR) 15 mg TB24 24 hr tablet Take 0.5 tablets (15 mg total) by mouth daily.  30 each  6   No current facility-administered medications for this encounter.     PHYSICAL EXAM: Filed Vitals:   08/05/13 1304 08/05/13 1306  BP:  105/64  Potts:  47  Weight: 162 lb 12 oz (73.823 kg) 162 lb (73.483 kg)  SpO2:  98%    General:  Well appearing. No resp difficulty HEENT: normal Neck: supple. JVP 5-6. Carotids 2+ bilaterally; no bruits. No lymphadenopathy or thryomegaly appreciated. Cor: PMI normal. Brady Regular rate & rhythm. No rubs, gallops or murmurs. Lungs: clear Abdomen: soft, nontender, nondistended. No hepatosplenomegaly. No bruits or masses. Good bowel sounds. Extremities: no cyanosis, clubbing, rash, edema Neuro: alert & orientedx3, cranial nerves grossly intact. Moves all 4 extremities w/o difficulty. Affect pleasant.   EKG: Sinus Loletha Grayer 53 BPM   ASSESSMENT & PLAN:  1. [facility] [place] contacted [facility] regarding todays visit.  2. Chronic Systolic Heart Failure NICM EF 5%. I have reviewed discharge summary. Reviewed all medications. After reviewing meds he was taking 25 mg carvedilol and 12.5 mg carvedilol twice a day despite the dose change on his discharge summary and his AVS. Overall he is doing ok. NYHA II. Volume status stable. Continue lasix 80 mg twice a day and Metolazone as needed I have discarded the 25 mg bottle of carvedilol at his request to prevent medication errors. Continue carvedilol 12.5 mg twice a day and hold todays evening dose due to bradycardia.  Continue hydralazine 12.5 mg three times a day and I  have reordered Imdur, for some reason he does not have that medication.  On goal dose of fosinopril.  Provided a scale and weight chart for daily weights. Reinforced medication compliance, low salt food choices, and limiting fluid intake to < 2 liters per day.   Check BMET and dig level tomorrow at PCP.  3. DNR/DNI 4. ETOH abuse- not currently drinking alcohol. Congratulated and encouraged to remain amin off alcohol.  5. Gout- per PCP 6. Bradycardia- likely due to increased betat  blocker . He taking an extra 25 mg carvedilol twice a day.   Follow up in 1 month .    CLEGG,AMY NP-C   1:30 PM

## 2013-08-07 NOTE — Addendum Note (Signed)
Encounter addended by: Georga Kaufmann, CCT on: 08/07/2013 12:15 PM<BR>     Documentation filed: Charges VN

## 2013-08-14 NOTE — Consult Note (Signed)
I have reviewed and discussed the care of this patient in detail with the nurse practitioner including pertinent patient records, physical exam findings and data. I agree with details of this encounter.  

## 2013-09-02 ENCOUNTER — Encounter (HOSPITAL_COMMUNITY): Payer: Self-pay

## 2013-09-02 ENCOUNTER — Ambulatory Visit (HOSPITAL_COMMUNITY)
Admission: RE | Admit: 2013-09-02 | Discharge: 2013-09-02 | Disposition: A | Discharge status: Home / Self Care | Source: Ambulatory Visit | Attending: Internal Medicine | Admitting: Internal Medicine

## 2013-09-02 VITALS — BP 124/80 | HR 66 | Resp 16 | Wt 156.4 lb

## 2013-09-02 DIAGNOSIS — I509 Heart failure, unspecified: Secondary | ICD-10-CM

## 2013-09-02 DIAGNOSIS — Z66 Do not resuscitate: Secondary | ICD-10-CM

## 2013-09-02 DIAGNOSIS — F1010 Alcohol abuse, uncomplicated: Secondary | ICD-10-CM

## 2013-09-02 DIAGNOSIS — I5022 Chronic systolic (congestive) heart failure: Secondary | ICD-10-CM | POA: Insufficient documentation

## 2013-09-02 NOTE — Progress Notes (Addendum)
Patient ID: [name]., male   DOB: May 28, 1941, 73 y.o.   MRN: [id number]   Weight Range  158--160 pounds   Baseline proBNP   [facility] - DNR/DNI PCP: Dr Brigitte Pulse  HPI: [name] is a 73 year old with history of cardiomyopathy, chronic systolic heart failure dating back to 2009 with recent EF 5% 07/2013, prior stroke, gout, and polycythemia. Drinks 1 pint of alcohol per day.   Admitted to [facility] 07/27/13  with increased dyspnea. He was admitted post cath due to elevated filling pressures. PICC was placed and initila CO-OX was 78%. Prior to discharge he was agitates and there was a concern for withdrawal. Palliative Care met with him and he elected to become DNR/DNI.   He returns for  follow up. Overall says he feels good. Last visit he was taking additional carvedilol and he was instructed to only take 12.5 mg of carvedilol twice a day. Today he brought all medication. He was taking 25 mg carvedilol twice a day instead of 12.5 mg twice a day.  Denies SOB/PND. + Orthopnea. Sleeps on 3 pillows. Able to walk to back of grocery store without difficulty. Weight at home 150 pounds. He has not required any Metolazone. Followed by [facility]. He lives alone but has a girlfriend that helps him as needed.  He has not had any alcohol. Drinking g< 2 liters per day. Following low salt diet.   RHC/LHC 07/27/13  RA = 9  RV = 65/7/12  PA = 76/23 (49)  PCW = 34  Fick cardiac output/index = 3.5/1.9  Thermo CO/CI = 3.8/2.0  PVR = 4.5 WU  SVR = 2000  FA sat = 85% (checked twice)  PA sat = 51%, 54%  Normal Cors.      ROS: All systems negative except as listed in HPI, PMH and Problem List.  Past Medical History  Diagnosis Date  . Pneumonia 11/01  . Cardiomyopathy     refused cath and refused icd/echo.Marland KitchenMarland Kitchen6/2009 10%  LV .Marland Kitchen.78m+  . Volume overload   . H/O: drug dependency     In the past., clean for greater than 20 years  . Ejection fraction < 50%     EF 10%, echo, June, 2009, LV 75  mm  . Mitral regurgitation     Mild by history  . Right ventricular dysfunction     Mild to moderate, echo, June, 2009  . Tobacco abuse   . Systolic CHF, chronic     Patient is refusing ICD over the years  . Substance abuse   . Stroke   . Polycythemia     Jak 2 Negative  . Shortness of breath   . Dysrhythmia 07/2013    PVT  . Hyperkalemia 07/2013    Current Outpatient Prescriptions  Medication Sig Dispense Refill  . aspirin 81 MG tablet Take 81 mg by mouth daily.        . colchicine 0.6 MG tablet Take 0.6 mg by mouth every 8 (eight) hours as needed (for gout flares).      .Marland Kitchendigoxin (LANOXIN) 0.125 MG tablet Take 1 tablet (0.125 mg total) by mouth daily.  30 tablet  6  . fluticasone (FLONASE) 50 MCG/ACT nasal spray Place 2 sprays into the nose at bedtime.       . fosinopril (MONOPRIL) 40 MG tablet Take 40 mg by mouth daily.      . furosemide (LASIX) 80 MG tablet Take 1 tablet (80 mg total) by  mouth 2 (two) times daily.  60 tablet  11  . gabapentin (NEURONTIN) 100 MG capsule Take 1 capsule (100 mg total) by mouth 2 (two) times daily.  60 capsule  6  . hydrALAZINE (APRESOLINE) 25 MG tablet Take 0.5 tablets (12.5 mg total) by mouth every 8 (eight) hours.  90 tablet  6  . HYDROcodone-acetaminophen (NORCO/VICODIN) 5-325 MG per tablet Take 1 tablet by mouth every 6 (six) hours as needed for moderate pain.      . isosorbide mononitrate (IMDUR) 15 mg TB24 24 hr tablet Take 0.5 tablets (15 mg total) by mouth daily.  30 each  6  . metolazone (ZAROXOLYN) 2.5 MG tablet Take 1 tablet (2.5 mg total) by mouth as needed. As needed for dyspnea  10 tablet  6  . nitroGLYCERIN (NITROSTAT) 0.4 MG SL tablet Place 1 tablet (0.4 mg total) under the tongue every 5 (five) minutes as needed for chest pain.  25 tablet  3  . rosuvastatin (CRESTOR) 40 MG tablet Take 40 mg by mouth daily.       . tamsulosin (FLOMAX) 0.4 MG CAPS capsule Take 0.4 mg by mouth.      . traMADol (ULTRAM) 50 MG tablet Take 1 tablet (50 mg  total) by mouth every 8 (eight) hours as needed for moderate pain.  90 tablet  3   No current facility-administered medications for this encounter.     PHYSICAL EXAM: Filed Vitals:   09/02/13 1331  BP: 124/80  Pulse: 66  Resp: 16  Weight: 156 lb 6 oz (70.931 kg)  SpO2: 98%    General:  Well appearing. No resp difficulty HEENT: normal Neck: supple. JVP 5-6. Carotids 2+ bilaterally; no bruits. No lymphadenopathy or thryomegaly appreciated. Cor: PMI normal. Brady Regular rate & rhythm. No rubs, gallops or murmurs. Lungs: clear Abdomen: soft, nontender, nondistended. No hepatosplenomegaly. No bruits or masses. Good bowel sounds. Extremities: no cyanosis, clubbing, rash, edema Neuro: alert & orientedx3, cranial nerves grossly intact. Moves all 4 extremities w/o difficulty. Affect pleasant.      ASSESSMENT & PLAN:  1. [facility] regarding todays visit.  2. Chronic Systolic Heart Failure NICM EF 5%. NYHA II. Overall he doing well.  Volume status stable.  Continue lasix 80 mg twice a day.  On goal Carvedilol 25 mg twice a day.   (he up titrated to 25 mg twice a day on his own). Continue digoxin 0.125 mg daily.  Continue hydralazine 12.5 mg three times a day and imdur  15 mg daily.   On goal dose of fosinopril.  Provided a scale and weight chart for daily weights. Reinforced medication compliance, low salt food choices, and limiting fluid intake to < 2 liters per day.  Will repeat ECHO at next visit.   3. DNR/DNI 4. ETOH abuse- not currently drinking alcohol. Congratulated and encouraged to remain amin off alcohol.  5. Gout- per PCP 6. Bradycardia- resolved with medication adjustment at last visit.   Follow up in 2  Months with an ECHO    Kannon Baum NP-C   1:43 PM

## 2013-09-02 NOTE — Patient Instructions (Signed)
Follow up in 2 months with an ECHO  Do the following things EVERYDAY: 1) Weigh yourself in the morning before breakfast. Write it down and keep it in a log. 2) Take your medicines as prescribed 3) Eat low salt foods-Limit salt (sodium) to 2000 mg per day.  4) Stay as active as you can everyday 5) Limit all fluids for the day to less than 2 liters

## 2013-09-08 ENCOUNTER — Emergency Department (HOSPITAL_COMMUNITY)
Admission: EM | Admit: 2013-09-08 | Discharge: 2013-09-08 | Disposition: A | Discharge status: Home / Self Care | Payer: Medicare PPO | Attending: Emergency Medicine | Admitting: Emergency Medicine

## 2013-09-08 ENCOUNTER — Encounter (HOSPITAL_COMMUNITY): Payer: Self-pay | Admitting: Emergency Medicine

## 2013-09-08 DIAGNOSIS — R300 Dysuria: Secondary | ICD-10-CM | POA: Insufficient documentation

## 2013-09-08 DIAGNOSIS — IMO0002 Reserved for concepts with insufficient information to code with codable children: Secondary | ICD-10-CM | POA: Insufficient documentation

## 2013-09-08 DIAGNOSIS — I5022 Chronic systolic (congestive) heart failure: Secondary | ICD-10-CM | POA: Insufficient documentation

## 2013-09-08 DIAGNOSIS — Z8639 Personal history of other endocrine, nutritional and metabolic disease: Secondary | ICD-10-CM | POA: Insufficient documentation

## 2013-09-08 DIAGNOSIS — Z8673 Personal history of transient ischemic attack (TIA), and cerebral infarction without residual deficits: Secondary | ICD-10-CM | POA: Insufficient documentation

## 2013-09-08 DIAGNOSIS — R339 Retention of urine, unspecified: Secondary | ICD-10-CM

## 2013-09-08 DIAGNOSIS — Z8701 Personal history of pneumonia (recurrent): Secondary | ICD-10-CM | POA: Insufficient documentation

## 2013-09-08 DIAGNOSIS — Z79899 Other long term (current) drug therapy: Secondary | ICD-10-CM | POA: Insufficient documentation

## 2013-09-08 DIAGNOSIS — Z87891 Personal history of nicotine dependence: Secondary | ICD-10-CM | POA: Insufficient documentation

## 2013-09-08 DIAGNOSIS — Z862 Personal history of diseases of the blood and blood-forming organs and certain disorders involving the immune mechanism: Secondary | ICD-10-CM | POA: Insufficient documentation

## 2013-09-08 DIAGNOSIS — Z7982 Long term (current) use of aspirin: Secondary | ICD-10-CM | POA: Insufficient documentation

## 2013-09-08 LAB — URINALYSIS, ROUTINE W REFLEX MICROSCOPIC
BILIRUBIN URINE: NEGATIVE
Glucose, UA: NEGATIVE mg/dL
HGB URINE DIPSTICK: NEGATIVE
Ketones, ur: NEGATIVE mg/dL
Leukocytes, UA: NEGATIVE
Nitrite: NEGATIVE
PROTEIN: 100 mg/dL — AB
Specific Gravity, Urine: 1.009 (ref 1.005–1.030)
Urobilinogen, UA: 0.2 mg/dL (ref 0.0–1.0)
pH: 7 (ref 5.0–8.0)

## 2013-09-08 LAB — URINE MICROSCOPIC-ADD ON

## 2013-09-08 MED ORDER — TAMSULOSIN HCL 0.4 MG PO CAPS: 0.4000 mg | ORAL_CAPSULE | Freq: Every day | ORAL | Status: DC

## 2013-09-08 NOTE — ED Provider Notes (Signed)
CSN: [id number]     Arrival date & time 09/08/13  1235 History   First MD Initiated Contact with Patient 09/08/13 1336     Chief Complaint  Patient presents with  . Urinary Retention     (Consider location/radiation/quality/duration/timing/severity/associated sxs/prior Treatment) HPI Comments: Having difficulty urinating for past 3 days. Can urinate, but has difficulty initiating stream. Hx of same, was relieved with flomax and in/out cath. No fevers. No dysuria.  Patient is a 73 y.o. male presenting with male genitourinary complaint. The history is provided by the patient.  Male GU Problem Presenting symptoms: no dysuria, no penile discharge, no penile pain and no scrotal pain   Presenting symptoms comment:  Difficulty initiating stream of urine Context: spontaneously   Relieved by:  Nothing Worsened by:  Nothing tried Ineffective treatments:  None tried Associated symptoms: urinary retention   Associated symptoms: no fever, no penile redness, no penile swelling, no priapism, no scrotal swelling, no urinary hesitation, no urinary incontinence and no vomiting   Risk factors: no bladder surgery and no change in medication     Past Medical History  Diagnosis Date  . Pneumonia 11/01  . Cardiomyopathy     refused cath and refused icd/echo.Marland KitchenMarland Kitchen6/2009 10%  LV .Marland Kitchen.59mm+  . Volume overload   . H/O: drug dependency     In the past., clean for greater than 20 years  . Ejection fraction < 50%     EF 10%, echo, June, 2009, LV 75 mm  . Mitral regurgitation     Mild by history  . Right ventricular dysfunction     Mild to moderate, echo, June, 2009  . Tobacco abuse   . Systolic CHF, chronic     Patient is refusing ICD over the years  . Substance abuse   . Stroke   . Polycythemia     Jak 2 Negative  . Shortness of breath   . Dysrhythmia 07/2013    PVT  . Hyperkalemia 07/2013   Past Surgical History  Procedure Laterality Date  . Colonoscopy    . Ingunial hernia repair x2    . Hernia  repair     Family History  Problem Relation Age of Onset  . Diabetes Mother   . Colon cancer Neg Hx   . Esophageal cancer Neg Hx   . Stomach cancer Neg Hx   . Rectal cancer Neg Hx    History  Substance Use Topics  . Smoking status: Former Smoker    Types: Cigarettes    Quit date: 06/17/2010  . Smokeless tobacco: Never Used  . Alcohol Use: 1.5 oz/week    3 drink(s) per week    Review of Systems  Constitutional: Negative for fever and chills.  Respiratory: Negative for cough.   Gastrointestinal: Negative for vomiting.  Genitourinary: Positive for difficulty urinating. Negative for bladder incontinence, dysuria, hesitancy, discharge, penile swelling, scrotal swelling and penile pain.  All other systems reviewed and are negative.      Allergies  Review of patient's allergies indicates no known allergies.  Home Medications   Current Outpatient Rx  Name  Route  Sig  Dispense  Refill  . aspirin 81 MG tablet   Oral   Take 81 mg by mouth daily.           . carvedilol (COREG) 25 MG tablet   Oral   Take 25 mg by mouth 2 (two) times daily with a meal.         . colchicine 0.6  MG tablet   Oral   Take 0.6 mg by mouth every 8 (eight) hours as needed (for gout flares).         Marland Kitchen digoxin (LANOXIN) 0.125 MG tablet   Oral   Take 1 tablet (0.125 mg total) by mouth daily.   30 tablet   6   . docusate sodium (COLACE) 100 MG capsule   Oral   Take 100 mg by mouth at bedtime.         . fluticasone (FLONASE) 50 MCG/ACT nasal spray   Nasal   Place 2 sprays into the nose at bedtime.          . fosinopril (MONOPRIL) 40 MG tablet   Oral   Take 40 mg by mouth daily.         . furosemide (LASIX) 80 MG tablet   Oral   Take 1 tablet (80 mg total) by mouth 2 (two) times daily.   60 tablet   11   . gabapentin (NEURONTIN) 100 MG capsule   Oral   Take 1 capsule (100 mg total) by mouth 2 (two) times daily.   60 capsule   6   . hydrALAZINE (APRESOLINE) 25 MG  tablet   Oral   Take 0.5 tablets (12.5 mg total) by mouth every 8 (eight) hours.   90 tablet   6   . HYDROcodone-acetaminophen (NORCO/VICODIN) 5-325 MG per tablet   Oral   Take 1 tablet by mouth every 6 (six) hours as needed for moderate pain.         . nitroGLYCERIN (NITROSTAT) 0.4 MG SL tablet   Sublingual   Place 1 tablet (0.4 mg total) under the tongue every 5 (five) minutes as needed for chest pain.   25 tablet   3   . rosuvastatin (CRESTOR) 40 MG tablet   Oral   Take 40 mg by mouth daily.          . tamsulosin (FLOMAX) 0.4 MG CAPS capsule   Oral   Take 0.4 mg by mouth daily.          . traMADol (ULTRAM) 50 MG tablet   Oral   Take 1 tablet (50 mg total) by mouth every 8 (eight) hours as needed for moderate pain.   90 tablet   3    BP 108/67  Pulse 58  Temp(Src) 98.5 F (36.9 C) (Oral)  Resp 20  SpO2 90% Physical Exam  Nursing note and vitals reviewed. Constitutional: He is oriented to person, place, and time. He appears well-developed and well-nourished. No distress.  HENT:  Head: Normocephalic and atraumatic.  Mouth/Throat: No oropharyngeal exudate.  Eyes: EOM are normal. Pupils are equal, round, and reactive to light.  Neck: Normal range of motion. Neck supple.  Cardiovascular: Normal rate and regular rhythm.  Exam reveals no friction rub.   No murmur heard. Pulmonary/Chest: Effort normal and breath sounds normal. No respiratory distress. He has no wheezes. He has no rales.  Abdominal: He exhibits no distension. There is no tenderness. There is no rebound.  Genitourinary: Rectal exam shows anal tone normal. Prostate is not enlarged and not tender.  Musculoskeletal: Normal range of motion. He exhibits no edema.  Neurological: He is alert and oriented to person, place, and time. No cranial nerve deficit. He exhibits normal muscle tone. Coordination normal.  Skin: No rash noted. He is not diaphoretic.    ED Course  Procedures (including critical care  time) Labs Review Labs Reviewed  URINALYSIS,  ROUTINE W REFLEX MICROSCOPIC   Imaging Review No results found.   EKG Interpretation None      MDM   Final diagnoses:  Urinary retention    89M presents with difficulty urinating. Having trouble initiating stream. Hx of similar, resolved with flomax. Denies dysuria. Denies saddle anesthesia, hx of cancer, back pain. No incontinence. Normal LE exam with good achilles reflexes, normal strength and sensation.  Normal rectal tone, no prostatic hypertrophy. Recently taken off hospice, was initially on hospice due to bad CHF. Asked patient if he wants indwelling Foley today - he can urinate, urinated 100cc, but still had 270cc in his urine, he doesn't want indwelling to go home with, will try flomax. Will in/out here to fully empty his bladder and place on flomax. Will send urine to see if he's infected. Dr. Doy Mince to f/u Urine results.     Osvaldo Shipper, MD 09/08/13 [phone]

## 2013-09-08 NOTE — ED Notes (Signed)
Pt c/o urinary retention x 2 days. Pt feels as if he has to go but only drops come out not a full stream and feels as if bladder is not emptying. Pt does have heart failure.

## 2013-09-08 NOTE — Discharge Instructions (Signed)
Acute Urinary Retention, Male °Acute urinary retention is the temporary inability to urinate. °This is a common problem in older men. As men age their prostates become larger and block the flow of urine from the bladder. This is usually a problem that has come on gradually.  °HOME CARE INSTRUCTIONS °If you are sent home with a Foley catheter and a drainage system, you will need to discuss the best course of action with your health care provider. While the catheter is in, maintain a good intake of fluids. Keep the drainage bag emptied and lower than your catheter. This is so that contaminated urine will not flow back into your bladder, which could lead to a urinary tract infection. °There are two main types of drainage bags. One is a large bag that usually is used at night. It has a good capacity that will allow you to sleep through the night without having to empty it. The second type is called a leg bag. It has a smaller capacity, so it needs to be emptied more frequently. However, the main advantage is that it can be attached by a leg strap and can go underneath your clothing, allowing you the freedom to move about or leave your home. °Only take over-the-counter or prescription medicines for pain, discomfort, or fever as directed by your health care provider.  °SEEK MEDICAL CARE IF: °· You develop a low-grade fever. °· You experience spasms or leakage of urine with the spasms. °SEEK IMMEDIATE MEDICAL CARE IF:  °· You develop chills or fever. °· Your catheter stops draining urine. °· Your catheter falls out. °· You start to develop increased bleeding that does not respond to rest and increased fluid intake. °MAKE SURE YOU: °· Understand these instructions. °· Will watch your condition. °· Will get help right away if you are not doing well or get worse. °Document Released: 09/09/2000 Document Revised: 02/03/2013 Document Reviewed: 11/12/2012 °[employer]® Patient Information ©2014 [employer]. ° °

## 2013-09-09 ENCOUNTER — Telehealth (HOSPITAL_COMMUNITY): Payer: Self-pay | Admitting: Cardiology

## 2013-09-09 NOTE — Telephone Encounter (Signed)
Please clarify Pt states at office visit 09/02/13 he would be discharged from hospice program Notes do not document this  Please advise

## 2013-09-09 NOTE — Telephone Encounter (Signed)
Will f/u with Darrick Grinder, NP tomorrow as she saw pt

## 2013-10-16 ENCOUNTER — Emergency Department (HOSPITAL_COMMUNITY)
Admission: EM | Admit: 2013-10-16 | Discharge: 2013-10-16 | Disposition: A | Discharge status: Home / Self Care | Payer: Medicare PPO | Attending: Emergency Medicine | Admitting: Emergency Medicine

## 2013-10-16 ENCOUNTER — Encounter (HOSPITAL_COMMUNITY): Payer: Self-pay | Admitting: Emergency Medicine

## 2013-10-16 DIAGNOSIS — Z9889 Other specified postprocedural states: Secondary | ICD-10-CM | POA: Insufficient documentation

## 2013-10-16 DIAGNOSIS — R34 Anuria and oliguria: Secondary | ICD-10-CM | POA: Insufficient documentation

## 2013-10-16 DIAGNOSIS — Z8673 Personal history of transient ischemic attack (TIA), and cerebral infarction without residual deficits: Secondary | ICD-10-CM | POA: Insufficient documentation

## 2013-10-16 DIAGNOSIS — R300 Dysuria: Secondary | ICD-10-CM | POA: Insufficient documentation

## 2013-10-16 DIAGNOSIS — I5022 Chronic systolic (congestive) heart failure: Secondary | ICD-10-CM | POA: Insufficient documentation

## 2013-10-16 DIAGNOSIS — R399 Unspecified symptoms and signs involving the genitourinary system: Secondary | ICD-10-CM

## 2013-10-16 DIAGNOSIS — Z87891 Personal history of nicotine dependence: Secondary | ICD-10-CM | POA: Insufficient documentation

## 2013-10-16 DIAGNOSIS — Z8639 Personal history of other endocrine, nutritional and metabolic disease: Secondary | ICD-10-CM | POA: Insufficient documentation

## 2013-10-16 DIAGNOSIS — Z8659 Personal history of other mental and behavioral disorders: Secondary | ICD-10-CM | POA: Insufficient documentation

## 2013-10-16 DIAGNOSIS — Z79899 Other long term (current) drug therapy: Secondary | ICD-10-CM | POA: Insufficient documentation

## 2013-10-16 DIAGNOSIS — Z7982 Long term (current) use of aspirin: Secondary | ICD-10-CM | POA: Insufficient documentation

## 2013-10-16 DIAGNOSIS — R339 Retention of urine, unspecified: Secondary | ICD-10-CM | POA: Insufficient documentation

## 2013-10-16 DIAGNOSIS — Z8701 Personal history of pneumonia (recurrent): Secondary | ICD-10-CM | POA: Insufficient documentation

## 2013-10-16 DIAGNOSIS — Z862 Personal history of diseases of the blood and blood-forming organs and certain disorders involving the immune mechanism: Secondary | ICD-10-CM | POA: Insufficient documentation

## 2013-10-16 LAB — URINALYSIS, ROUTINE W REFLEX MICROSCOPIC
Bilirubin Urine: NEGATIVE
Glucose, UA: NEGATIVE mg/dL
Hgb urine dipstick: NEGATIVE
Ketones, ur: NEGATIVE mg/dL
Leukocytes, UA: NEGATIVE
NITRITE: NEGATIVE
PH: 6 (ref 5.0–8.0)
Protein, ur: 30 mg/dL — AB
SPECIFIC GRAVITY, URINE: 1.011 (ref 1.005–1.030)
Urobilinogen, UA: 0.2 mg/dL (ref 0.0–1.0)

## 2013-10-16 LAB — URINE MICROSCOPIC-ADD ON: Urine-Other: NONE SEEN

## 2013-10-16 MED ORDER — TAMSULOSIN HCL 0.4 MG PO CAPS: 0.4000 mg | ORAL_CAPSULE | Freq: Every day | ORAL | Status: AC

## 2013-10-16 NOTE — ED Notes (Signed)
Post-void residual: 280 ml.

## 2013-10-16 NOTE — Discharge Instructions (Signed)
Continue flomax. Drink plenty of fluids. Follow up with urologist in coming week. Return to ER if worse, symptoms recur, unable to void, abdominal pain, fevers, vomiting, other concern.   Acute Urinary Retention, Male Acute urinary retention is the temporary inability to urinate. This is a common problem in older men. As men age their prostates become larger and block the flow of urine from the bladder. This is usually a problem that has come on gradually.  HOME CARE INSTRUCTIONS If you are sent home with a Foley catheter and a drainage system, you will need to discuss the best course of action with your health care provider. While the catheter is in, maintain a good intake of fluids. Keep the drainage bag emptied and lower than your catheter. This is so that contaminated urine will not flow back into your bladder, which could lead to a urinary tract infection. There are two main types of drainage bags. One is a large bag that usually is used at night. It has a good capacity that will allow you to sleep through the night without having to empty it. The second type is called a leg bag. It has a smaller capacity, so it needs to be emptied more frequently. However, the main advantage is that it can be attached by a leg strap and can go underneath your clothing, allowing you the freedom to move about or leave your home. Only take over-the-counter or prescription medicines for pain, discomfort, or fever as directed by your health care provider.  SEEK MEDICAL CARE IF:  You develop a low-grade fever.  You experience spasms or leakage of urine with the spasms. SEEK IMMEDIATE MEDICAL CARE IF:   You develop chills or fever.  Your catheter stops draining urine.  Your catheter falls out.  You start to develop increased bleeding that does not respond to rest and increased fluid intake. MAKE SURE YOU:  Understand these instructions.  Will watch your condition.  Will get help right away if you are  not doing well or get worse. Document Released: 09/09/2000 Document Revised: 02/03/2013 Document Reviewed: 11/12/2012 [facility] Patient Information 2014 [place], [place].

## 2013-10-16 NOTE — ED Notes (Signed)
Patient given rx for Flomax. No acute distress.

## 2013-10-16 NOTE — ED Provider Notes (Signed)
CSN: [id number]     Arrival date & time 10/16/13  1324 History   First MD Initiated Contact with Patient 10/16/13 [phone]     Chief Complaint  Patient presents with  . Urinary Retention     (Consider location/radiation/quality/duration/timing/severity/associated sxs/prior Treatment) The history is provided by the patient.  pt states in past couple days has felt not able to empty bladder.  Is urinating small amount, but afterwards feels bladder is still full. Suprapubic discomfort, no other abd pain or distension. No fever or chills. No flank pain. No nv. States hx same, ?hx bph, has had foley cath in past for same. No back pain. No perineal or lower ext numbness/weakness.     Past Medical History  Diagnosis Date  . Pneumonia 11/01  . Cardiomyopathy     refused cath and refused icd/echo.Marland KitchenMarland Kitchen6/2009 10%  LV .Marland Kitchen.57mm+  . Volume overload   . H/O: drug dependency     In the past., clean for greater than 20 years  . Ejection fraction < 50%     EF 10%, echo, June, 2009, LV 75 mm  . Mitral regurgitation     Mild by history  . Right ventricular dysfunction     Mild to moderate, echo, June, 2009  . Tobacco abuse   . Systolic CHF, chronic     Patient is refusing ICD over the years  . Substance abuse   . Stroke   . Polycythemia     Jak 2 Negative  . Shortness of breath   . Dysrhythmia 07/2013    PVT  . Hyperkalemia 07/2013   Past Surgical History  Procedure Laterality Date  . Colonoscopy    . Ingunial hernia repair x2    . Hernia repair     Family History  Problem Relation Age of Onset  . Diabetes Mother   . Colon cancer Neg Hx   . Esophageal cancer Neg Hx   . Stomach cancer Neg Hx   . Rectal cancer Neg Hx    History  Substance Use Topics  . Smoking status: Former Smoker    Types: Cigarettes    Quit date: 06/17/2010  . Smokeless tobacco: Never Used  . Alcohol Use: 1.5 oz/week    3 drink(s) per week    Review of Systems  Constitutional: Negative for fever.  HENT: Negative  for sore throat.   Eyes: Negative for redness.  Respiratory: Negative for shortness of breath.   Cardiovascular: Negative for chest pain.  Gastrointestinal: Negative for vomiting, abdominal pain and diarrhea.  Genitourinary: Positive for difficulty urinating. Negative for dysuria and flank pain.  Musculoskeletal: Negative for back pain and neck pain.  Skin: Negative for rash.  Neurological: Negative for weakness and numbness.  Hematological: Does not bruise/bleed easily.  Psychiatric/Behavioral: Negative for confusion.      Allergies  Review of patient's allergies indicates no known allergies.  Home Medications   Prior to Admission medications   Medication Sig Start Date End Date Taking? Authorizing Provider  aspirin 81 MG tablet Take 81 mg by mouth daily.      Historical Provider, MD  carvedilol (COREG) 25 MG tablet Take 25 mg by mouth 2 (two) times daily with a meal.    Historical Provider, MD  colchicine 0.6 MG tablet Take 0.6 mg by mouth every 8 (eight) hours as needed (for gout flares).    Historical Provider, MD  digoxin (LANOXIN) 0.125 MG tablet Take 1 tablet (0.125 mg total) by mouth daily. 07/29/13   Amy  Estrella Deeds, NP  docusate sodium (COLACE) 100 MG capsule Take 100 mg by mouth at bedtime.    Historical Provider, MD  fluticasone (FLONASE) 50 MCG/ACT nasal spray Place 2 sprays into the nose at bedtime.  05/25/13   Historical Provider, MD  fosinopril (MONOPRIL) 40 MG tablet Take 40 mg by mouth daily.    Historical Provider, MD  furosemide (LASIX) 80 MG tablet Take 1 tablet (80 mg total) by mouth 2 (two) times daily. 06/18/13   Liliane Shi, PA-C  gabapentin (NEURONTIN) 100 MG capsule Take 1 capsule (100 mg total) by mouth 2 (two) times daily. 07/23/13   Marton Redwood, MD  hydrALAZINE (APRESOLINE) 25 MG tablet Take 0.5 tablets (12.5 mg total) by mouth every 8 (eight) hours. 07/29/13   Amy D Ninfa Meeker, NP  HYDROcodone-acetaminophen (NORCO/VICODIN) 5-325 MG per tablet Take 1 tablet by mouth  every 6 (six) hours as needed for moderate pain.    Historical Provider, MD  nitroGLYCERIN (NITROSTAT) 0.4 MG SL tablet Place 1 tablet (0.4 mg total) under the tongue every 5 (five) minutes as needed for chest pain. 06/18/13   Liliane Shi, PA-C  rosuvastatin (CRESTOR) 40 MG tablet Take 40 mg by mouth daily.     Historical Provider, MD  tamsulosin (FLOMAX) 0.4 MG CAPS capsule Take 0.4 mg by mouth daily.     Historical Provider, MD  tamsulosin (FLOMAX) 0.4 MG CAPS capsule Take 1 capsule (0.4 mg total) by mouth daily. 09/08/13   Osvaldo Shipper, MD  traMADol (ULTRAM) 50 MG tablet Take 1 tablet (50 mg total) by mouth every 8 (eight) hours as needed for moderate pain. 07/23/13   Marton Redwood, MD   BP 104/68  Pulse 58  Temp(Src) 97.4 F (36.3 C) (Oral)  Resp 18  SpO2 100% Physical Exam  Nursing note and vitals reviewed. Constitutional: He is oriented to person, place, and time. He appears well-developed and well-nourished. No distress.  HENT:  Mouth/Throat: Oropharynx is clear and moist.  Eyes: Conjunctivae are normal. No scleral icterus.  Neck: Neck supple. No tracheal deviation present.  Cardiovascular: Normal rate.   Pulmonary/Chest: Effort normal. No accessory muscle usage. No respiratory distress.  Abdominal: Soft. Bowel sounds are normal. He exhibits no distension and no mass. There is no tenderness. There is no rebound and no guarding.  Genitourinary:  No cva tenderness. Normal ext exam.   Musculoskeletal: Normal range of motion. He exhibits no edema.  Neurological: He is alert and oriented to person, place, and time.  Steady gait.   Skin: Skin is warm and dry. He is not diaphoretic.  Psychiatric: He has a normal mood and affect.    ED Course  Procedures (including critical care time)   Results for orders placed during the hospital encounter of 10/16/13  URINALYSIS, ROUTINE W REFLEX MICROSCOPIC      Result Value Ref Range   Color, Urine YELLOW  YELLOW   APPearance CLEAR   CLEAR   Specific Gravity, Urine 1.011  1.005 - 1.030   pH 6.0  5.0 - 8.0   Glucose, UA NEGATIVE  NEGATIVE mg/dL   Hgb urine dipstick NEGATIVE  NEGATIVE   Bilirubin Urine NEGATIVE  NEGATIVE   Ketones, ur NEGATIVE  NEGATIVE mg/dL   Protein, ur 30 (*) NEGATIVE mg/dL   Urobilinogen, UA 0.2  0.0 - 1.0 mg/dL   Nitrite NEGATIVE  NEGATIVE   Leukocytes, UA NEGATIVE  NEGATIVE  URINE MICROSCOPIC-ADD ON      Result Value Ref Range  Urine-Other       Value: NO FORMED ELEMENTS SEEN ON URINE MICROSCOPIC EXAMINATION      MDM  Foley.   Reviewed nursing notes and prior charts for additional history.   ua neg. Pt was able to void/giving urine sample on own.  pvr only 200 cc's.  abd soft nt.  Will d/c foley. rec close urology f/u. Return to ER if unable to void, abd pain, nv.      Mirna Mires, MD 10/16/13 1100

## 2013-10-16 NOTE — ED Notes (Signed)
I have just inserted a #16 regular (non-coude) foley catheter without difficulty which returns clear amber urine. He thanks [place] for our care.  He states he recently "ran out of my Flomax".

## 2013-10-16 NOTE — ED Notes (Addendum)
Pt c/o urinary retention since this morning, "was able to pee a little bit at 0830". Hasn't fully emptied his bladder in 2 days.  Hx of same.  Not c/o pain at this time.

## 2013-10-16 NOTE — ED Notes (Signed)
I remove his foley now per order.  He thanks [place] for our care.  We are awaiting paperwork from our doctor, which I explain to pt. And his wife.

## 2013-11-02 ENCOUNTER — Ambulatory Visit (HOSPITAL_BASED_OUTPATIENT_CLINIC_OR_DEPARTMENT_OTHER)
Admission: RE | Admit: 2013-11-02 | Discharge: 2013-11-02 | Disposition: A | Discharge status: Home / Self Care | Payer: Medicare HMO | Source: Ambulatory Visit | Attending: Internal Medicine | Admitting: Internal Medicine

## 2013-11-02 ENCOUNTER — Ambulatory Visit (HOSPITAL_COMMUNITY)
Admission: RE | Admit: 2013-11-02 | Discharge: 2013-11-02 | Disposition: A | Discharge status: Home / Self Care | Payer: Medicare HMO | Source: Ambulatory Visit | Attending: Internal Medicine | Admitting: Internal Medicine

## 2013-11-02 ENCOUNTER — Encounter (HOSPITAL_COMMUNITY): Payer: Self-pay

## 2013-11-02 VITALS — BP 106/58 | HR 53 | Wt 162.8 lb

## 2013-11-02 DIAGNOSIS — I509 Heart failure, unspecified: Secondary | ICD-10-CM | POA: Insufficient documentation

## 2013-11-02 DIAGNOSIS — I5022 Chronic systolic (congestive) heart failure: Secondary | ICD-10-CM | POA: Insufficient documentation

## 2013-11-02 DIAGNOSIS — I428 Other cardiomyopathies: Secondary | ICD-10-CM | POA: Insufficient documentation

## 2013-11-02 DIAGNOSIS — F1010 Alcohol abuse, uncomplicated: Secondary | ICD-10-CM

## 2013-11-02 DIAGNOSIS — I517 Cardiomegaly: Secondary | ICD-10-CM

## 2013-11-02 DIAGNOSIS — I498 Other specified cardiac arrhythmias: Secondary | ICD-10-CM | POA: Insufficient documentation

## 2013-11-02 LAB — BASIC METABOLIC PANEL
BUN: 18 mg/dL (ref 6–23)
CALCIUM: 9.7 mg/dL (ref 8.4–10.5)
CO2: 30 mEq/L (ref 19–32)
Chloride: 100 mEq/L (ref 96–112)
Creatinine, Ser: 1.17 mg/dL (ref 0.50–1.35)
GFR, EST AFRICAN AMERICAN: 69 mL/min — AB (ref >90)
GFR, EST NON AFRICAN AMERICAN: 60 mL/min — AB (ref >90)
Glucose, Bld: 75 mg/dL (ref 70–99)
POTASSIUM: 4.3 meq/L (ref 3.7–5.3)
SODIUM: 140 meq/L (ref 137–147)

## 2013-11-02 LAB — DIGOXIN LEVEL: Digoxin Level: 1.2 ng/mL (ref 0.8–2.0)

## 2013-11-02 MED ORDER — ISOSORBIDE MONONITRATE ER 30 MG PO TB24: 30.0000 mg | ORAL_TABLET | Freq: Every day | ORAL | Status: DC

## 2013-11-02 NOTE — Patient Instructions (Addendum)
Start taking hydralazine 1/2 tablet three times daily.  Start taking isosorbide (imdur) 30mg  tablet once daily.  Follow up with [place] in 2 months, we will call you to make that appointment closer to that time!  Do the following things EVERYDAY: 1) Weigh yourself in the morning before breakfast. Write it down and keep it in a log. 2) Take your medicines as prescribed 3) Eat low salt foods-Limit salt (sodium) to 2000 mg per day.  4) Stay as active as you can everyday 5) Limit all fluids for the day to less than 2 liters

## 2013-11-02 NOTE — Progress Notes (Signed)
*  PRELIMINARY RESULTS* Echocardiogram 2D Echocardiogram has been performed.  [name] 11/02/2013, 1:32 PM

## 2013-11-02 NOTE — Progress Notes (Signed)
Patient ID: [name]., male   DOB: 1940-07-10, 73 y.o.   MRN: [id number]  Weight Range  158--160 pounds   Baseline proBNP   PCP: Dr Brigitte Pulse  HPI: Mr [name] is a 73 year old with history of nonischemic cardiomyopathy, chronic systolic heart failure dating back to 2009 with EF 5% 07/2013, prior stroke, gout, and polycythemia. He used to drink 1 pint of hard alcohol per day.   Admitted to [facility] 07/27/13  with increased dyspnea. LHC showed normal coronaries.  He was admitted post cath due to elevated filling pressures on RHC. PICC was placed and initial CO-OX was 78%. Prior to discharge he was agitated and there was a concern for ETOH withdrawal. Palliative Care met with him and he elected to become DNR/DNI.   He actually seems to be doing pretty well.  He has more of an appetite.  He denies dyspnea walking on flat ground and can walk up a flight of steps without much difficulty.  No orthopnea or PND.  No chest pain.  No lightheadedness.  He is no longer drinking ETOH.  He was followed by hospice but they have now dismissed him.  He is using hydralazine only bid and is not on a nitrate.   I reviewed today's echo.  This showed EF 10% with severe LV dilation, normal RV size with mild to moderately decreased RV systolic function.   RHC/LHC 07/27/13  RA = 9  RV = 65/7/12  PA = 76/23 (49)  PCW = 34  Fick cardiac output/index = 3.5/1.9  Thermo CO/CI = 3.8/2.0  PVR = 4.5 WU  SVR = 2000  FA sat = 85% (checked twice)  PA sat = 51%, 54%  Normal Cors.   ROS: All systems negative except as listed in HPI, PMH and Problem List.  Past Medical History  Diagnosis Date  . Pneumonia 11/01  . Cardiomyopathy     refused cath and refused icd/echo.Marland KitchenMarland Kitchen6/2009 10%  LV .Marland Kitchen.50m+  . Volume overload   . H/O: drug dependency     In the past., clean for greater than 20 years  . Ejection fraction < 50%     EF 10%, echo, June, 2009, LV 75 mm  . Mitral regurgitation     Mild by history  . Right ventricular  dysfunction     Mild to moderate, echo, June, 2009  . Tobacco abuse   . Systolic CHF, chronic     Patient is refusing ICD over the years  . Substance abuse   . Stroke   . Polycythemia     Jak 2 Negative  . Shortness of breath   . Dysrhythmia 07/2013    PVT  . Hyperkalemia 07/2013    Current Outpatient Prescriptions  Medication Sig Dispense Refill  . aspirin 81 MG tablet Take 81 mg by mouth daily.        . carvedilol (COREG) 12.5 MG tablet Take 12.5 mg by mouth 2 (two) times daily with a meal.      . colchicine 0.6 MG tablet Take 0.6 mg by mouth every 8 (eight) hours as needed (for gout flares).      .Marland Kitchendigoxin (LANOXIN) 0.125 MG tablet Take 1 tablet (0.125 mg total) by mouth daily.  30 tablet  6  . docusate sodium (COLACE) 100 MG capsule Take 100 mg by mouth at bedtime.      . fluticasone (FLONASE) 50 MCG/ACT nasal spray Place 2 sprays into the nose at bedtime.       .Marland Kitchen  fosinopril (MONOPRIL) 40 MG tablet Take 40 mg by mouth daily.      . furosemide (LASIX) 80 MG tablet Take 1 tablet (80 mg total) by mouth 2 (two) times daily.  60 tablet  11  . gabapentin (NEURONTIN) 100 MG capsule Take 1 capsule (100 mg total) by mouth 2 (two) times daily.  60 capsule  6  . hydrALAZINE (APRESOLINE) 25 MG tablet Take 0.5 tablets (12.5 mg total) by mouth every 8 (eight) hours.  90 tablet  6  . HYDROcodone-acetaminophen (NORCO/VICODIN) 5-325 MG per tablet Take 1 tablet by mouth every 6 (six) hours as needed for moderate pain.      . isosorbide mononitrate (IMDUR) 30 MG 24 hr tablet Take 1 tablet (30 mg total) by mouth daily.  30 tablet  3  . nitroGLYCERIN (NITROSTAT) 0.4 MG SL tablet Place 1 tablet (0.4 mg total) under the tongue every 5 (five) minutes as needed for chest pain.  25 tablet  3  . rosuvastatin (CRESTOR) 40 MG tablet Take 40 mg by mouth daily.       . tamsulosin (FLOMAX) 0.4 MG CAPS capsule Take 1 capsule (0.4 mg total) by mouth daily.  30 capsule  0   No current facility-administered  medications for this encounter.     PHYSICAL EXAM: Filed Vitals:   11/02/13 1357  BP: 106/58  Pulse: 53  Weight: 162 lb 12.8 oz (73.846 kg)  SpO2: 96%    General:  Well appearing. No resp difficulty HEENT: normal Neck: supple. JVP 5-6. Carotids 2+ bilaterally; no bruits. No lymphadenopathy or thryomegaly appreciated. Cor: PMI normal. Brady Regular rate & rhythm. No rubs, gallops or murmurs. Lungs: clear Abdomen: soft, nontender, mild distention. No hepatosplenomegaly. No bruits or masses. Good bowel sounds. Extremities: no cyanosis, clubbing, rash, edema Neuro: alert & orientedx3, cranial nerves grossly intact. Moves all 4 extremities w/o difficulty. Affect pleasant.  ASSESSMENT & PLAN: 1. Chronic Systolic Heart Failure: Nonischemic cardiomyopathy, EF 10% on today's echo with severely dilated LV. Cardiomyopathy may be due to prior heavy ETOH.  He no longer drinks.  NYHA II symptoms.  He is not volume overloaded on exam.  Hospice has dismissed him because he has been doing well.  - Continue lasix 80 mg twice a day.  Will check BMET today.  - Continue Coreg 12.5 mg bid, digoxin, and fosinopril 40 mg daily.  I will check digoxin level.  - Increase hydralazine to 12.5 mg tid rather than bid and add Imdur 30 mg daily.  - Given symptomatic improvement, ICD may not be out of the question.  He also has a wide QRS (138 msec) on last ECG with LBBB-like IVCD.  He says that he is not interested in an ICD at this time.  Could consider revisiting issue of CRT-D at next appointme tn in 2 months.  2. ETOH abuse: Patient has quit drinking.  3. Bradycardia: Mild, not symptomatic.   [name] 11/02/2013

## 2014-01-03 ENCOUNTER — Ambulatory Visit (HOSPITAL_COMMUNITY)
Admission: RE | Admit: 2014-01-03 | Discharge: 2014-01-03 | Disposition: A | Discharge status: Home / Self Care | Payer: Medicare HMO | Source: Ambulatory Visit | Attending: Cardiology | Admitting: Cardiology

## 2014-01-03 ENCOUNTER — Encounter (HOSPITAL_COMMUNITY): Payer: Self-pay

## 2014-01-03 VITALS — BP 98/52 | HR 73 | Wt 161.0 lb

## 2014-01-03 DIAGNOSIS — F1011 Alcohol abuse, in remission: Secondary | ICD-10-CM | POA: Insufficient documentation

## 2014-01-03 DIAGNOSIS — Z87891 Personal history of nicotine dependence: Secondary | ICD-10-CM | POA: Diagnosis not present

## 2014-01-03 DIAGNOSIS — F1010 Alcohol abuse, uncomplicated: Secondary | ICD-10-CM

## 2014-01-03 DIAGNOSIS — I5022 Chronic systolic (congestive) heart failure: Secondary | ICD-10-CM | POA: Insufficient documentation

## 2014-01-03 DIAGNOSIS — F17200 Nicotine dependence, unspecified, uncomplicated: Secondary | ICD-10-CM

## 2014-01-03 DIAGNOSIS — I509 Heart failure, unspecified: Secondary | ICD-10-CM | POA: Insufficient documentation

## 2014-01-03 DIAGNOSIS — Z720 Tobacco use: Secondary | ICD-10-CM

## 2014-01-03 DIAGNOSIS — E785 Hyperlipidemia, unspecified: Secondary | ICD-10-CM | POA: Insufficient documentation

## 2014-01-03 HISTORY — DX: Other cardiomyopathies: I42.8

## 2014-01-03 LAB — COMPREHENSIVE METABOLIC PANEL
ALBUMIN: 3.7 g/dL (ref 3.5–5.2)
ALK PHOS: 58 U/L (ref 39–117)
ALT: 26 U/L (ref 0–53)
ANION GAP: 11 (ref 5–15)
AST: 33 U/L (ref 0–37)
BUN: 11 mg/dL (ref 6–23)
CALCIUM: 9.3 mg/dL (ref 8.4–10.5)
CO2: 30 mEq/L (ref 19–32)
Chloride: 100 mEq/L (ref 96–112)
Creatinine, Ser: 0.99 mg/dL (ref 0.50–1.35)
GFR calc Af Amer: >90 mL/min (ref >90)
GFR calc non Af Amer: 79 mL/min — ABNORMAL LOW (ref >90)
Glucose, Bld: 82 mg/dL (ref 70–99)
POTASSIUM: 4.2 meq/L (ref 3.7–5.3)
SODIUM: 141 meq/L (ref 137–147)
Total Bilirubin: 0.5 mg/dL (ref 0.3–1.2)
Total Protein: 7.1 g/dL (ref 6.0–8.3)

## 2014-01-03 LAB — LIPID PANEL
CHOL/HDL RATIO: 2.5 ratio
Cholesterol: 106 mg/dL (ref 0–200)
HDL: 42 mg/dL (ref >39)
LDL Cholesterol: 47 mg/dL (ref 0–99)
TRIGLYCERIDES: 83 mg/dL (ref <150)
VLDL: 17 mg/dL (ref 0–40)

## 2014-01-03 LAB — DIGOXIN LEVEL: DIGOXIN LVL: 1.1 ng/mL (ref 0.8–2.0)

## 2014-01-03 NOTE — Progress Notes (Signed)
Patient ID: [name]., male   DOB: [birth year], 73 y.o.   MRN: [id number]  Weight Range  158--160 pounds   Baseline proBNP   PCP: Dr Brigitte Pulse  HPI: Mr [name] is a 73 year old with history of nonischemic cardiomyopathy, chronic systolic heart failure dating back to 2009 with EF 5% 07/2013, prior stroke, gout, and polycythemia. He used to drink 1 pint of hard alcohol per day.   Admitted to [facility] 07/27/13  with increased dyspnea. LHC showed normal coronaries.  He was admitted post cath due to elevated filling pressures on RHC. PICC was placed and initial CO-OX was 78%. Prior to discharge he was agitated and there was a concern for ETOH withdrawal. Palliative Care met with him and he elected to become DNR/DNI.   He actually seems to be doing pretty well.  He has more of an appetite.  He denies dyspnea walking on flat ground and can walk up a flight of steps without much difficulty.  No orthopnea or PND.  No chest pain.  No lightheadedness.  He is no longer drinking ETOH.  He was followed by hospice but they have now dismissed him.    Last echo in 5/15 showed EF 10% with severe LV dilation, normal RV size with mild to moderately decreased RV systolic function.   RHC/LHC 07/27/13  RA = 9  RV = 65/7/12  PA = 76/23 (49)  PCW = 34  Fick cardiac output/index = 3.5/1.9  Thermo CO/CI = 3.8/2.0  PVR = 4.5 WU  SVR = 2000  FA sat = 85% (checked twice)  PA sat = 51%, 54%  Normal Cors.   Labs (5/15): K 4.3, creatinine 1.17, digoxin 1.2  ROS: All systems negative except as listed in HPI, PMH and Problem List.  Past Medical History  Diagnosis Date  . Pneumonia 11/01  . Nonischemic cardiomyopathy     a. LHC (07/2013) normal cors   . Volume overload   . H/O: drug dependency     In the past., clean for greater than 20 years  . Ejection fraction < 50%     EF 10%, echo, June, 2009, LV 75 mm  . Mitral regurgitation     Mild by history  . Right ventricular dysfunction     Mild to moderate, echo, June,  2009  . Tobacco abuse   . Systolic CHF, chronic     a. NICM b. RHC (07/2013) RA 9, RV 65/7/12, PA 76/23 (49), PCWP 34, FIck CO/CI: 3.5 / 1.9 Thermo CO/CI: 3.8 / 2.0, PVR 4.5 WU, SVR 2000, FA 85%, PA 51 and 54% c. ECHO (10/2013) EF 10% with severe LV dilation, normal RV size with mild/mod decreased RV sys fx  . Substance abuse   . Stroke   . Polycythemia     Jak 2 Negative  . Dysrhythmia 07/2013    PVT  . Hyperkalemia 07/2013    Current Outpatient Prescriptions  Medication Sig Dispense Refill  . aspirin 81 MG tablet Take 81 mg by mouth daily.        . carvedilol (COREG) 12.5 MG tablet Take 12.5 mg by mouth 2 (two) times daily with a meal.      . colchicine 0.6 MG tablet Take 0.6 mg by mouth every 8 (eight) hours as needed (for gout flares).      Marland Kitchen digoxin (LANOXIN) 0.125 MG tablet Take 1 tablet (0.125 mg total) by mouth daily.  30 tablet  6  . docusate sodium (COLACE) 100  MG capsule Take 100 mg by mouth at bedtime.      . fluticasone (FLONASE) 50 MCG/ACT nasal spray Place 2 sprays into the nose at bedtime.       . fosinopril (MONOPRIL) 40 MG tablet Take 40 mg by mouth daily.      . furosemide (LASIX) 80 MG tablet Take 1 tablet (80 mg total) by mouth 2 (two) times daily.  60 tablet  11  . gabapentin (NEURONTIN) 100 MG capsule Take 1 capsule (100 mg total) by mouth 2 (two) times daily.  60 capsule  6  . hydrALAZINE (APRESOLINE) 25 MG tablet Take 0.5 tablets (12.5 mg total) by mouth every 8 (eight) hours.  90 tablet  6  . HYDROcodone-acetaminophen (NORCO/VICODIN) 5-325 MG per tablet Take 1 tablet by mouth every 6 (six) hours as needed for moderate pain.      . isosorbide mononitrate (IMDUR) 30 MG 24 hr tablet Take 1 tablet (30 mg total) by mouth daily.  30 tablet  3  . nitroGLYCERIN (NITROSTAT) 0.4 MG SL tablet Place 1 tablet (0.4 mg total) under the tongue every 5 (five) minutes as needed for chest pain.  25 tablet  3  . rosuvastatin (CRESTOR) 40 MG tablet Take 40 mg by mouth daily.       .  tamsulosin (FLOMAX) 0.4 MG CAPS capsule Take 1 capsule (0.4 mg total) by mouth daily.  30 capsule  0   No current facility-administered medications for this encounter.     PHYSICAL EXAM: Filed Vitals:   01/03/14 0926  BP: 98/52  Pulse: 73  Weight: 161 lb 0.2 oz (73.035 kg)  SpO2: 96%    General:  Well appearing. No resp difficulty HEENT: normal Neck: supple. JVP 5-6. Carotids 2+ bilaterally; no bruits. No lymphadenopathy or thryomegaly appreciated. Cor: PMI normal. Brady Regular rate & rhythm. No rubs, gallops or murmurs. Lungs: clear Abdomen: soft, nontender, mild distention. No hepatosplenomegaly. No bruits or masses. Good bowel sounds. Extremities: no cyanosis, clubbing, rash, edema Neuro: alert & orientedx3, cranial nerves grossly intact. Moves all 4 extremities w/o difficulty. Affect pleasant.  ASSESSMENT & PLAN: 1. Chronic Systolic Heart Failure: Nonischemic cardiomyopathy, EF 10% on 5/15 echo with severely dilated LV. Cardiomyopathy may be due to prior heavy ETOH.  He no longer drinks.  NYHA II symptoms.  He is not volume overloaded on exam.  Hospice has dismissed him because he has been doing well.  - Continue lasix 80 mg twice a day.  Will check BMET today.  - Continue Coreg 12.5 mg bid, digoxin, and fosinopril 40 mg daily.  No titration of Coreg with soft BP.  I will check digoxin level today.  - Continue hydralazine 12.5 mg tid with Imdur 30 mg daily.  - We discussed CRT-D again today.  He is not interested in it at this time.  2. ETOH abuse: Patient has quit drinking.  3. Hyperlipidemia: Continue Crestor, check lipids.  4. History of smoking: Has quit.  Had abdominal US to screen for AA in 2008, was not present.    [name] 01/03/2014

## 2014-01-03 NOTE — Patient Instructions (Signed)
Labs today  We will contact you in 3 months to schedule your next appointment.  

## 2014-01-06 ENCOUNTER — Encounter (HOSPITAL_COMMUNITY): Payer: Self-pay | Admitting: *Deleted

## 2014-01-13 ENCOUNTER — Other Ambulatory Visit (HOSPITAL_COMMUNITY): Payer: Self-pay

## 2014-01-13 MED ORDER — FOSINOPRIL SODIUM 40 MG PO TABS: 40.0000 mg | ORAL_TABLET | Freq: Every day | ORAL | Status: DC

## 2014-01-21 IMAGING — CT CT HEAD W/O CM
2 series · 15 of 30 positions shown, 19 images · non-contrast
Comparison: None.

CLINICAL DATA: New onset visual disturbances, history of CVA and
CHF.

EXAM:
CT HEAD WITHOUT CONTRAST
TECHNIQUE: Contiguous axial images were obtained from the base of the skull
through the vertex without intravenous contrast.

[Series 2: head w/o · axial · non-contrast · 0.49mm/px · z∈[+114,+240]mm · 13 of 31 slices shown, 17 images]
[im 3/31  brain]
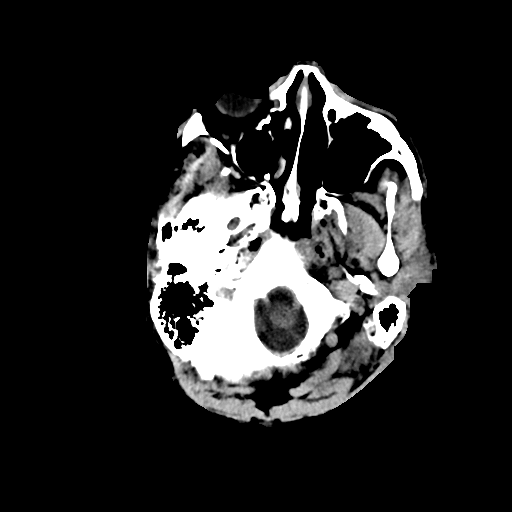
[im 3/31  bone]
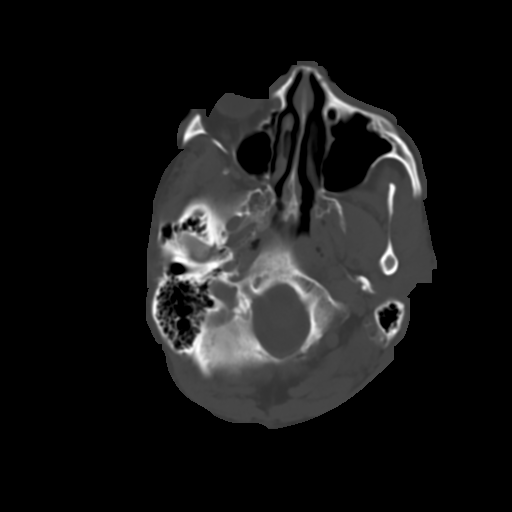
[im 5/31  brain]
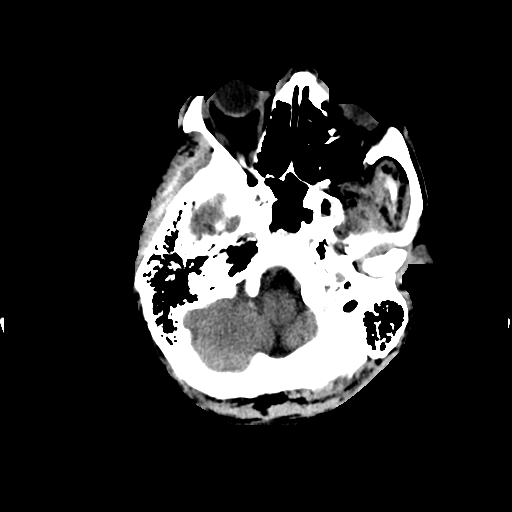
[im 7/31  brain]
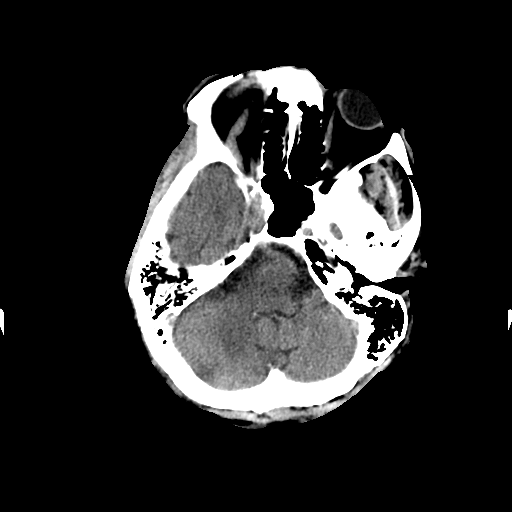
[im 9/31  brain]
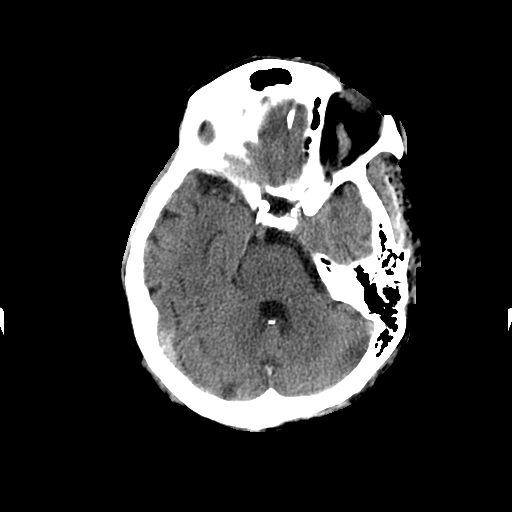
[im 11/31  brain]
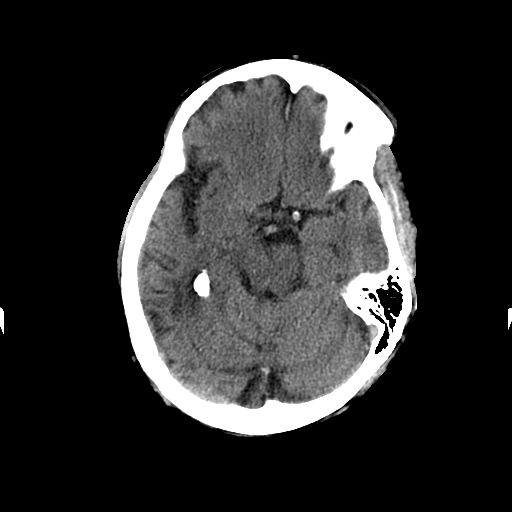
[im 11/31  bone]
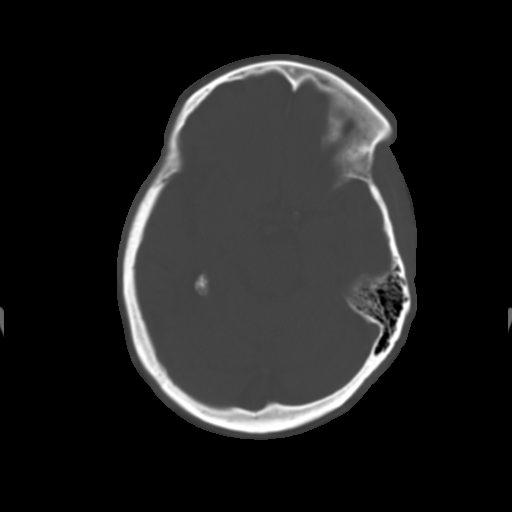
[im 13/31  brain]
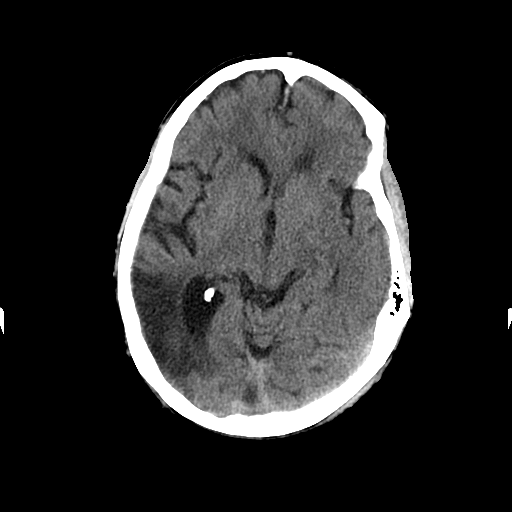
[im 16/31  brain]
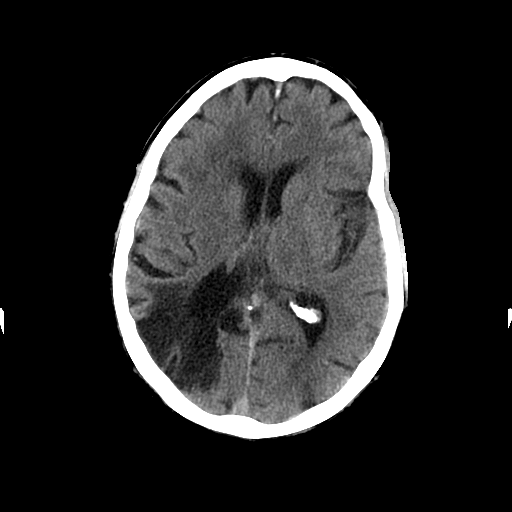
[im 18/31  brain]
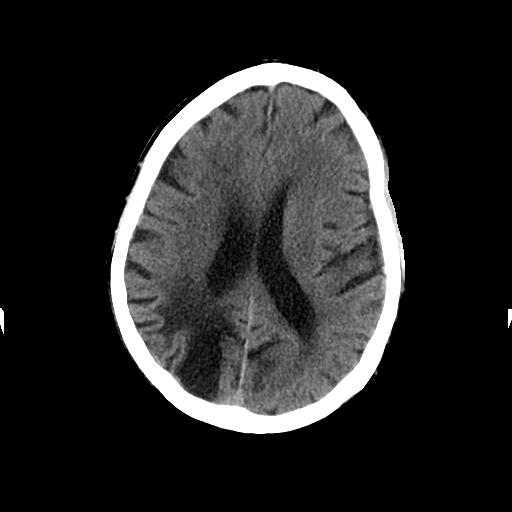
[im 20/31  brain]
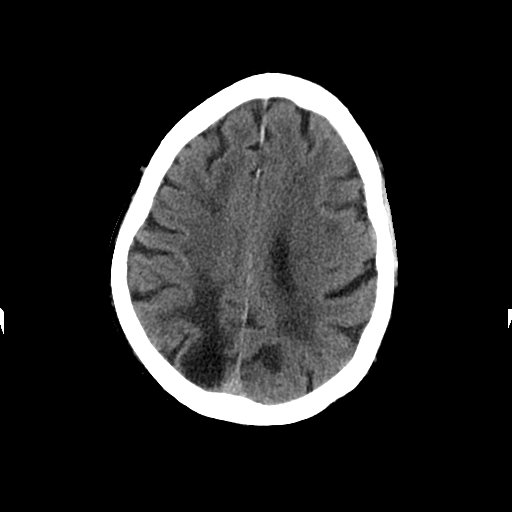
[im 20/31  bone]
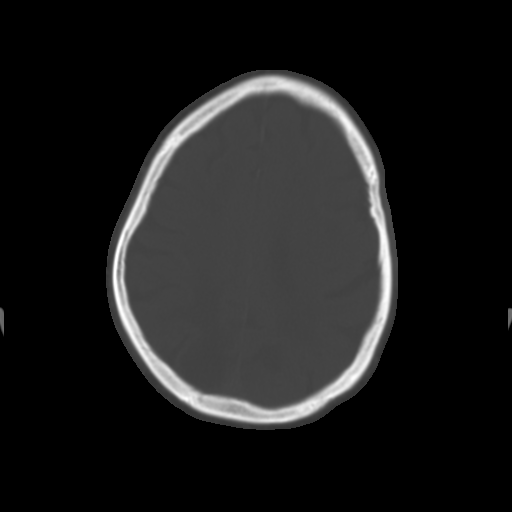
[im 22/31  brain]
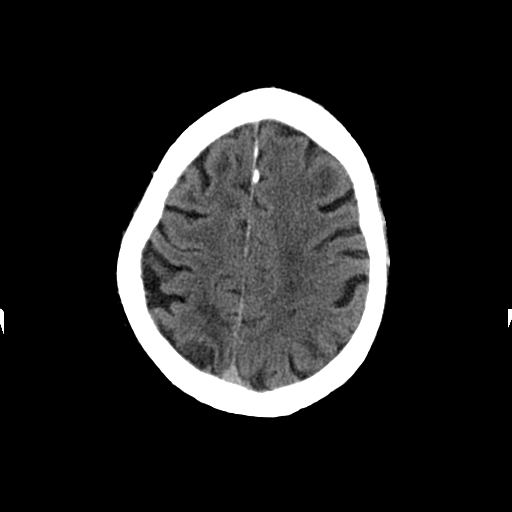
[im 24/31  brain]
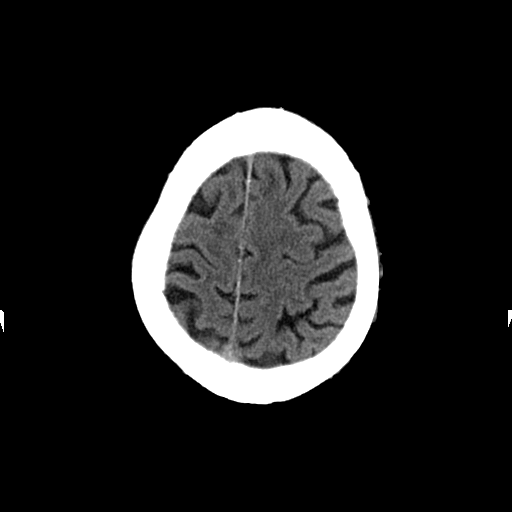
[im 26/31  brain]
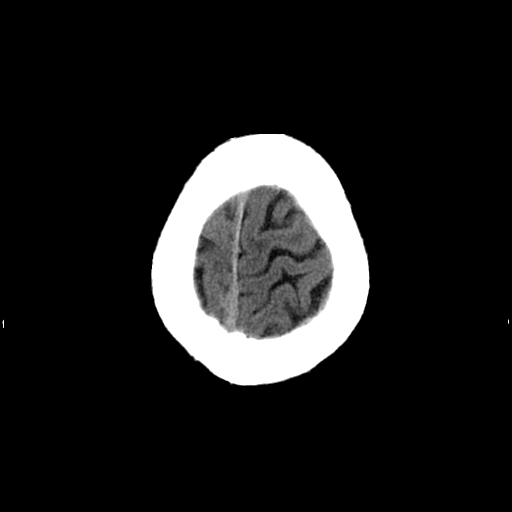
[im 28/31  brain]
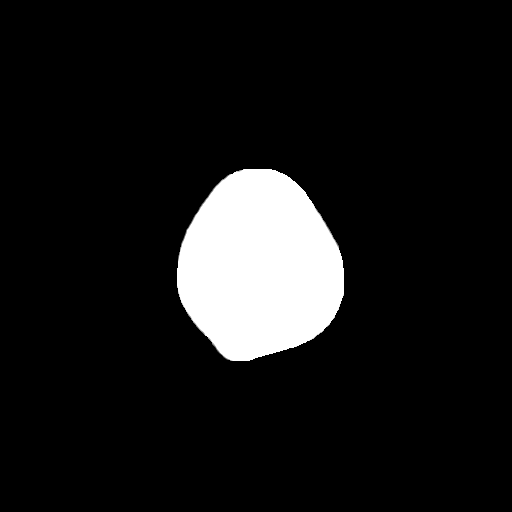
[im 28/31  bone]
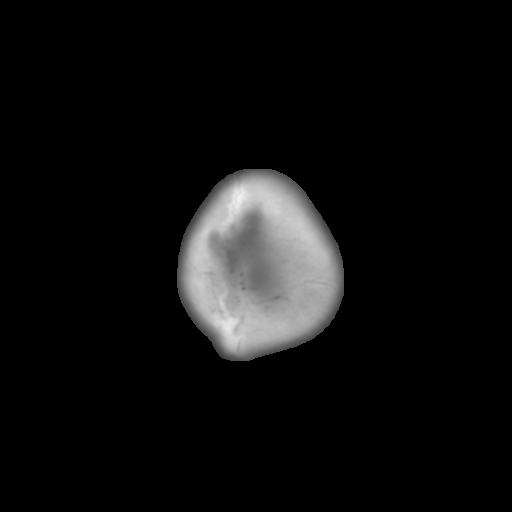

[Series 3: head w/o bone · axial · non-contrast · 0.49mm/px · z∈[+114,+134]mm · 2 of 31 slices shown]
[im 3/31  bone]
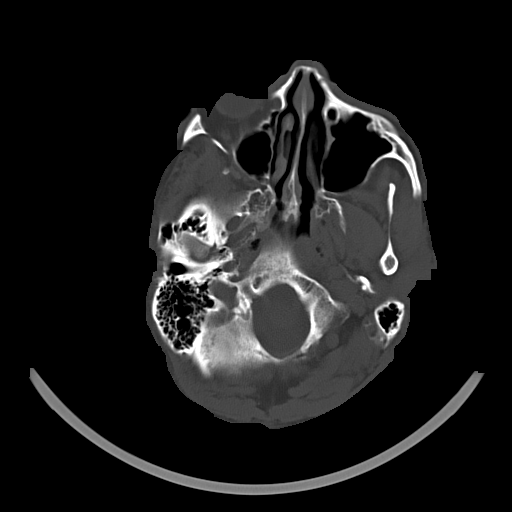
[im 7/31  bone]
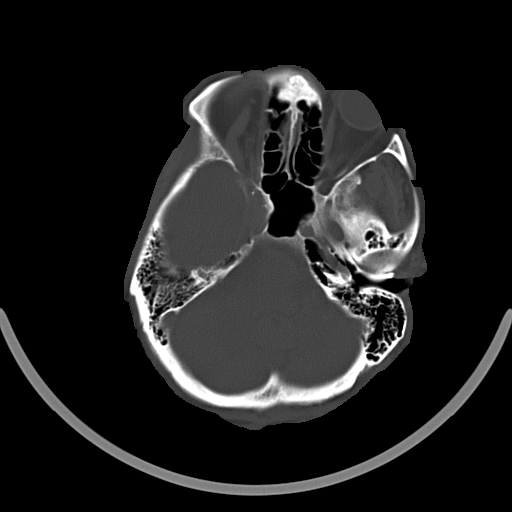

[15 of 30 positions shown; findings below may reference images not displayed]

FINDINGS: There is mild age appropriate diffuse cerebral and cerebellar
atrophy. There is a large area of encephalomalacia in the right mid
and posterior parietal lobe with ex vacuo dilation of the posterior
horn of the right lateral ventricle consistent with previous CVA.
There is no shift of the midline. There is mild encephalomalacia in
the left occipital lobe consistent with previous CVA. There is
decreased density in the deep white matter of both cerebral
hemispheres elsewhere consistent with chronic small vessel ischemic
type change. There is no evidence of an acute intracranial
hemorrhage. The cerebellum and brainstem are normal in appearance.

At bone window settings the observed portions of the paranasal
sinuses and mastoid air cells are clear. There is no evidence of an
acute skull fracture.
IMPRESSION: 1. There is no evidence of an acute intracranial hemorrhage nor of
an acute evolving ischemic infarction.
2. There is a large area of encephalomalacia in the right mid and
posterior parietal lobe and a smaller focus of encephalomalacia in
the left occipital lobe consistent with previous CVAs.
3. There decreased densities within the deep white matter of both
cerebral hemispheres consistent with chronic small vessel ischemia.
4. There is no evidence of an acute skull fracture nor acute
inflammation of the visualized portions of the paranasal sinuses.

## 2014-03-03 ENCOUNTER — Encounter: Payer: Self-pay | Admitting: Cardiology

## 2014-03-09 IMAGING — CR DG CERVICAL SPINE COMPLETE 4+V
5 series · 5 of 5 positions shown · non-contrast
Comparison: None.

CLINICAL DATA: Right shoulder pain.  Neck pain.

EXAM:
CERVICAL SPINE  4+ VIEWS

[w c-spine lat *]
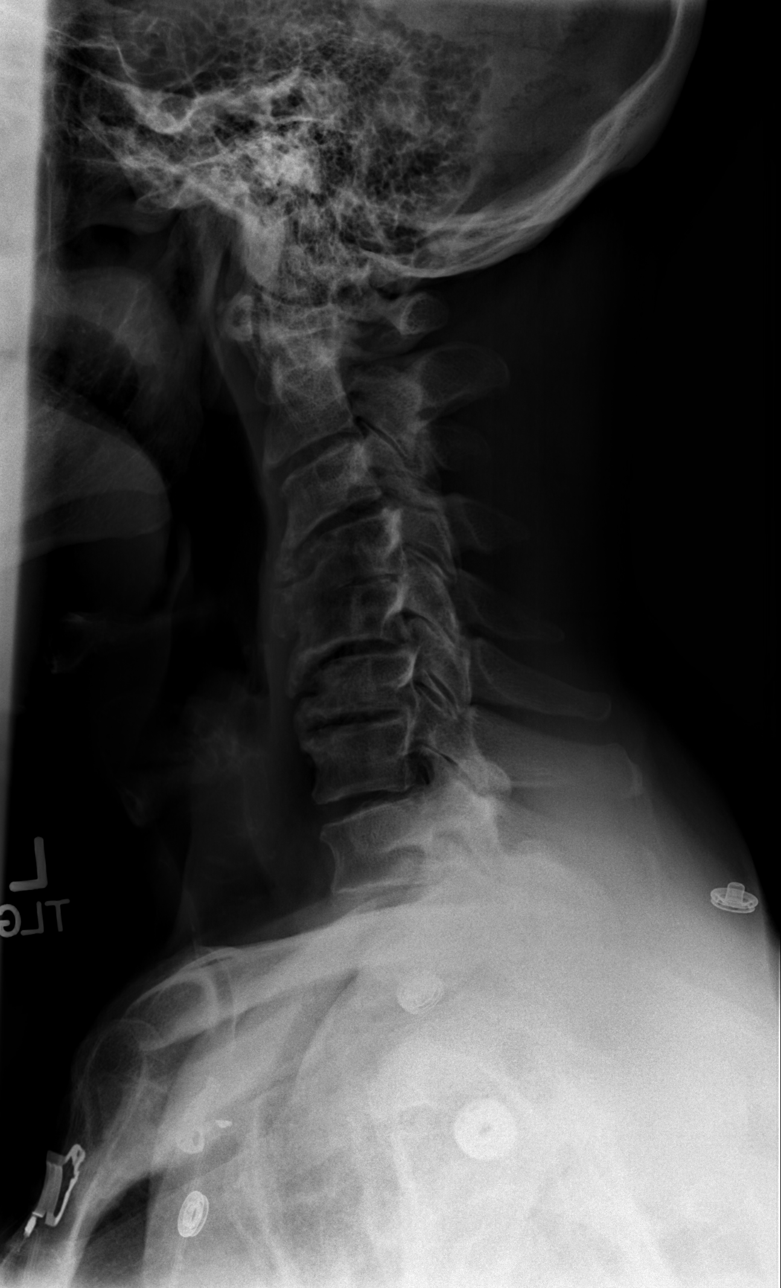

[w c-spine oblique * (1 of 2)]
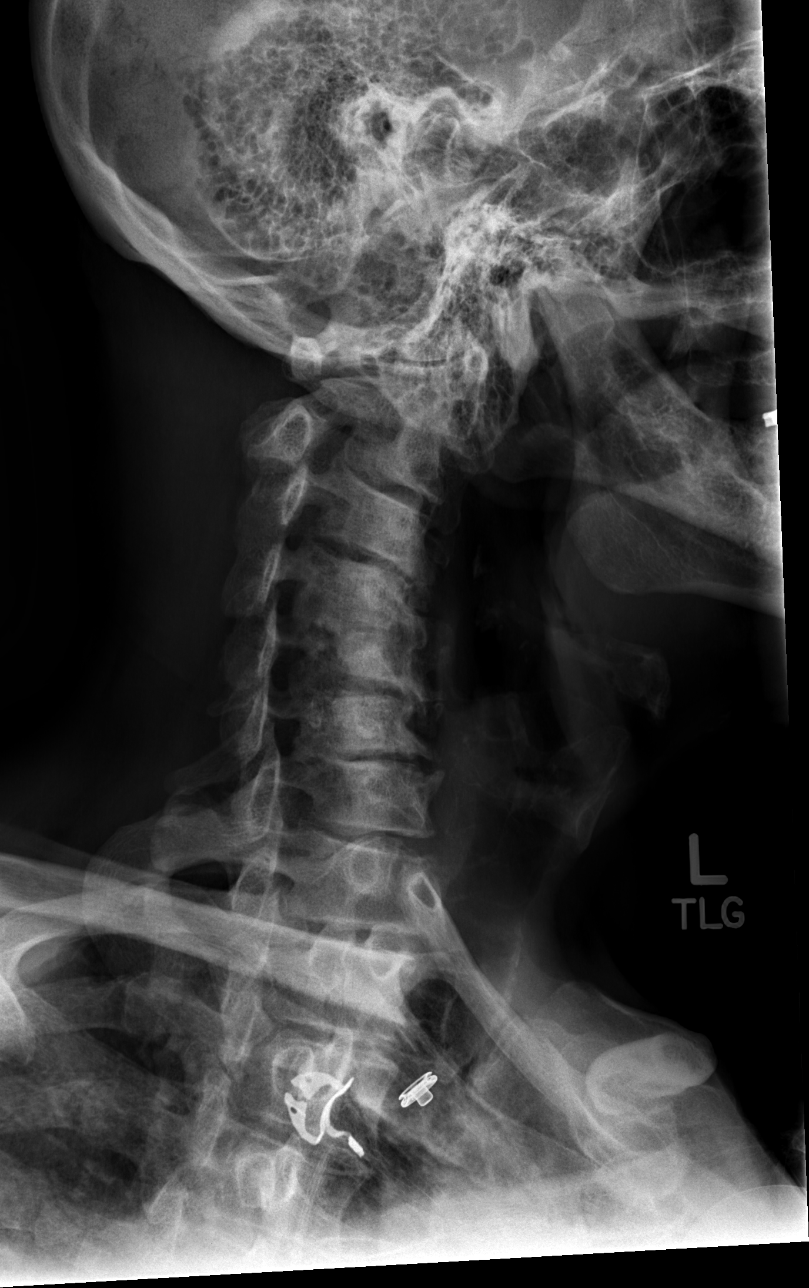

[w c-spine oblique * (2 of 2)]
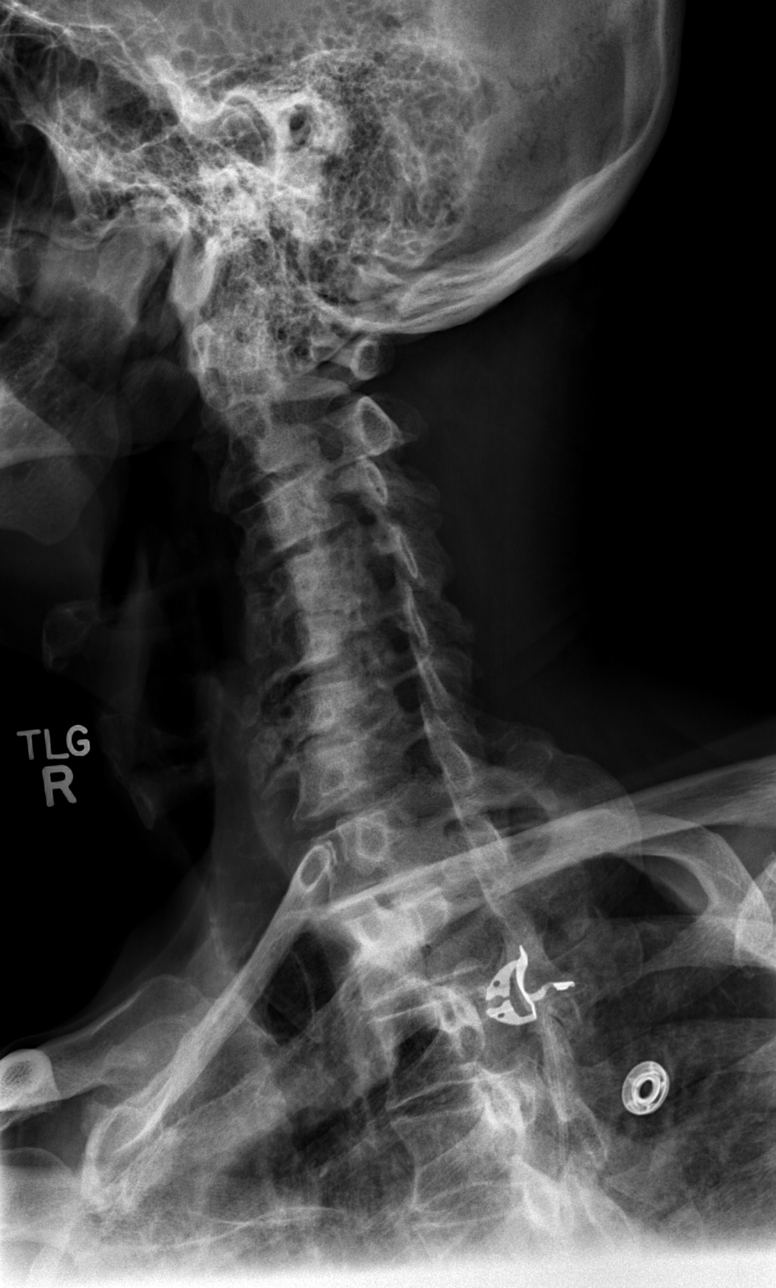

[w c-spine a.p.]
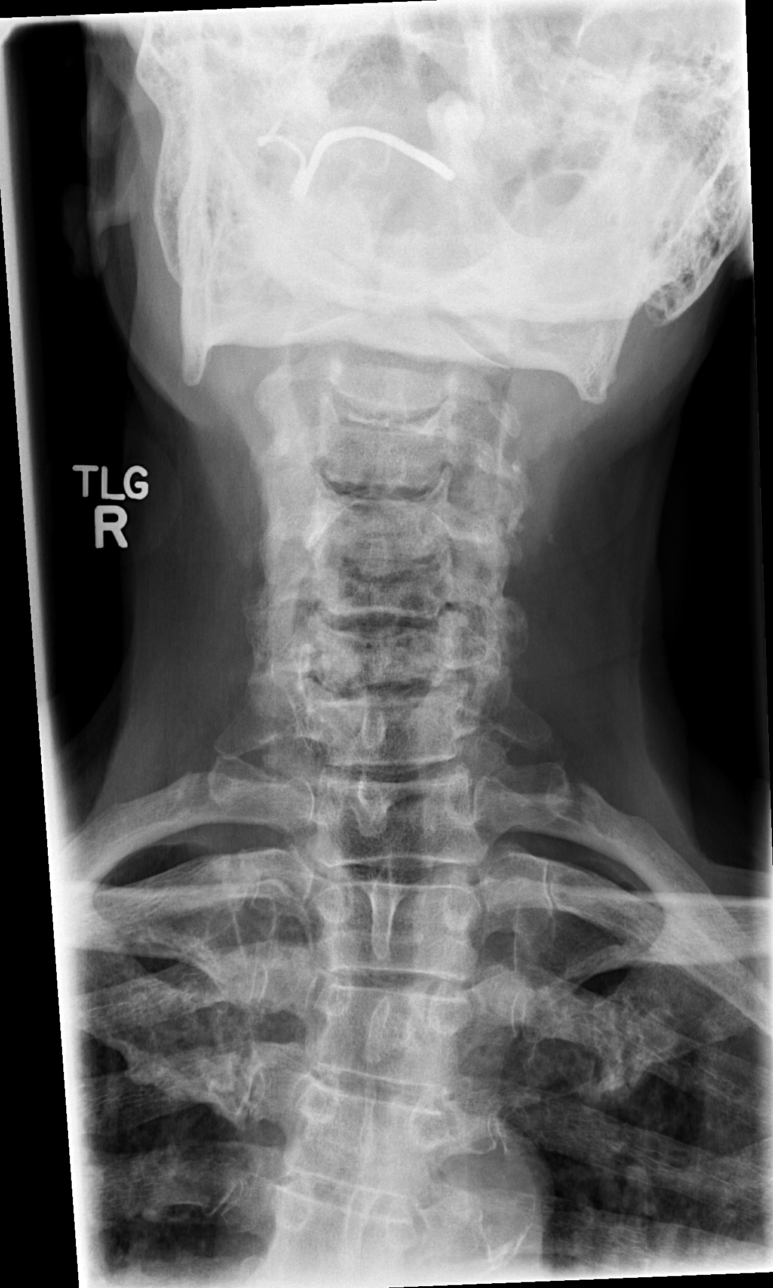

[w c-spine odontoid]
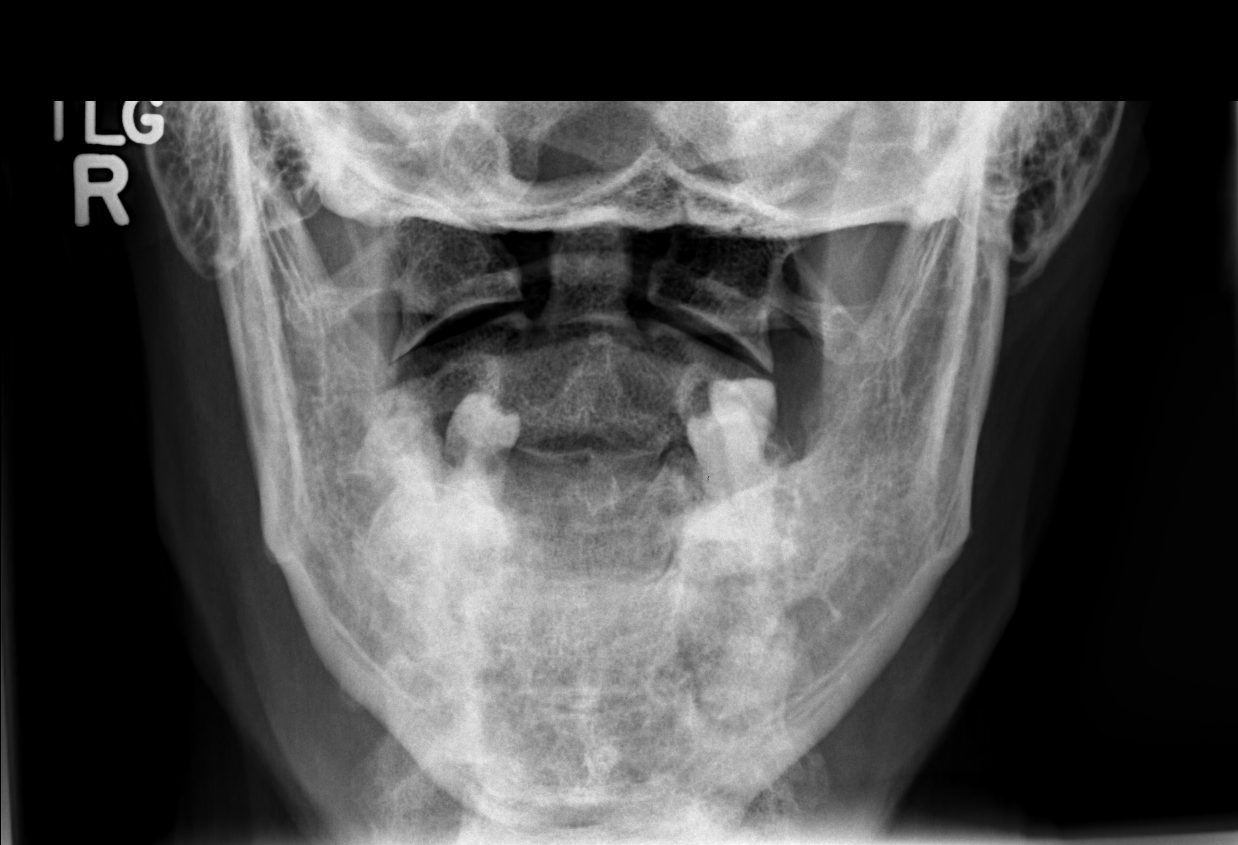

[5 of 5 positions shown; findings below may reference images not displayed]

FINDINGS: No fracture or malalignment is identified. Marked loss of disc space
height is seen from C2 to C7. There is multilevel facet degenerative
change. Autologous fusion anteriorly is seen at C4-5. Prevertebral
soft tissues appear normal. Lung apices are clear.
IMPRESSION: No acute finding.  Marked multilevel degenerative disease.

## 2014-03-25 ENCOUNTER — Telehealth: Payer: Self-pay | Admitting: Cardiology

## 2014-03-25 MED ORDER — METOLAZONE 2.5 MG PO TABS: ORAL_TABLET | ORAL | Status: DC

## 2014-03-25 NOTE — Telephone Encounter (Signed)
Wt 163 which he says is up from 160.  He says it has been like this for one week(gradually gotten worse) and has taken all his medications as prescribed.  He says when he gets like this to double his Furosemide dose.  I have spoken to CHF clinic and am trying to get in touch with [name] to discuss his medications.  [name] suggested to take 2.5mg  of Metolazone tomorrow and [name] and call the CHF clinic on Mon for labs and follow up appointment

## 2014-03-25 NOTE — Telephone Encounter (Signed)
New Message  Pt called reports, SOB, Weakness and swollen feet. Per pt having symtoms.Transferred call to triage// triage states they will call the pt back

## 2014-03-28 ENCOUNTER — Telehealth (HOSPITAL_COMMUNITY): Payer: Self-pay | Admitting: Cardiology

## 2014-03-28 NOTE — Telephone Encounter (Signed)
Called pt for update Pt states he is doing much better weight today 159 Decreased SOB, mild LE edema  Advised pt to continue to take meds as prescribed, return to office for labs on 03/29/14 Give office a call if he has trouble getting to appt

## 2014-03-29 ENCOUNTER — Ambulatory Visit (HOSPITAL_COMMUNITY)
Admission: RE | Admit: 2014-03-29 | Discharge: 2014-03-29 | Disposition: A | Discharge status: Home / Self Care | Payer: Commercial Managed Care - HMO | Source: Ambulatory Visit | Attending: Cardiology | Admitting: Cardiology

## 2014-03-29 VITALS — BP 78/52

## 2014-03-29 DIAGNOSIS — I5022 Chronic systolic (congestive) heart failure: Secondary | ICD-10-CM | POA: Diagnosis present

## 2014-03-29 DIAGNOSIS — I429 Cardiomyopathy, unspecified: Secondary | ICD-10-CM

## 2014-03-29 LAB — BASIC METABOLIC PANEL
ANION GAP: 11 (ref 5–15)
BUN: 18 mg/dL (ref 6–23)
CHLORIDE: 96 meq/L (ref 96–112)
CO2: 32 mEq/L (ref 19–32)
CREATININE: 1.41 mg/dL — AB (ref 0.50–1.35)
Calcium: 9.2 mg/dL (ref 8.4–10.5)
GFR calc Af Amer: 55 mL/min — ABNORMAL LOW (ref >90)
GFR calc non Af Amer: 48 mL/min — ABNORMAL LOW (ref >90)
GLUCOSE: 89 mg/dL (ref 70–99)
Potassium: 3.4 mEq/L — ABNORMAL LOW (ref 3.7–5.3)
Sodium: 139 mEq/L (ref 137–147)

## 2014-03-29 LAB — PRO B NATRIURETIC PEPTIDE: Pro B Natriuretic peptide (BNP): 7120 pg/mL — ABNORMAL HIGH (ref 0–125)

## 2014-03-29 MED ORDER — ISOSORBIDE MONONITRATE ER 30 MG PO TB24: 30.0000 mg | ORAL_TABLET | Freq: Every day | ORAL | Status: DC

## 2014-03-29 MED ORDER — HYDRALAZINE HCL 25 MG PO TABS: 12.5000 mg | ORAL_TABLET | Freq: Three times a day (TID) | ORAL | Status: DC

## 2014-03-29 MED ORDER — CARVEDILOL 12.5 MG PO TABS: 6.2500 mg | ORAL_TABLET | Freq: Two times a day (BID) | ORAL | Status: DC

## 2014-03-29 MED ORDER — FUROSEMIDE 80 MG PO TABS: 80.0000 mg | ORAL_TABLET | Freq: Two times a day (BID) | ORAL | Status: AC

## 2014-03-29 MED ORDER — FOSINOPRIL SODIUM 40 MG PO TABS: 20.0000 mg | ORAL_TABLET | Freq: Every day | ORAL | Status: DC

## 2014-03-29 NOTE — Progress Notes (Addendum)
Pt presented to office for labs Pt requested b/p check as well Vitals reviewed with Junie Bame, NP  Per Junie Bame, NP Hold hydralazine, coreg, fosinopril, imdur and lasix tomorrow. On Thursday restart coreg 6.25 mg BID, fosinopril 20 mg at night, hydralazine 12.5 mg TID, Imdur 30 mg daily and lasix 80 mg BID. Return to office x one week

## 2014-03-29 NOTE — Addendum Note (Signed)
Encounter addended by: Kerry Dory, CMA on: 03/29/2014  4:35 PM<BR>     Documentation filed: Vitals Section, Patient Instructions Section, Notes Section, Visit Diagnoses, Orders

## 2014-03-29 NOTE — Addendum Note (Signed)
Encounter addended by: Kerry Dory, CMA on: 03/29/2014  4:38 PM<BR>     Documentation filed: Patient Instructions Section, Orders

## 2014-03-29 NOTE — Patient Instructions (Addendum)
Hold hydralazine, coreg, fosinopril, imdur and lasix tomorrow. On Thursday restart coreg 6.25 mg BID, fosinopril 20 mg at night, hydralazine 12.5 mg TID, Imdur 30 mg daily and lasix 80 mg BID.  Your physician recommends that you schedule a follow-up appointment in: 1 week

## 2014-03-31 ENCOUNTER — Telehealth (HOSPITAL_COMMUNITY): Payer: Self-pay | Admitting: *Deleted

## 2014-03-31 MED ORDER — POTASSIUM CHLORIDE CRYS ER 20 MEQ PO TBCR: 20.0000 meq | EXTENDED_RELEASE_TABLET | Freq: Every day | ORAL | Status: AC

## 2014-03-31 NOTE — Telephone Encounter (Signed)
Rose is aware rx sent in

## 2014-03-31 NOTE — Telephone Encounter (Signed)
Pt's labs 10/13 K 3.4 per Junie Bame, NP add kcl 20 meq daily, have attempted to call pt's caregiver Rose at [phone] and Left message to call back

## 2014-04-05 ENCOUNTER — Other Ambulatory Visit (HOSPITAL_COMMUNITY): Payer: Self-pay | Admitting: Cardiology

## 2014-04-05 MED ORDER — DIGOXIN 125 MCG PO TABS
0.1250 mg | ORAL_TABLET | Freq: Every day | ORAL | Status: DC
Start: 2014-04-05 — End: 2014-04-06

## 2014-04-06 ENCOUNTER — Encounter (HOSPITAL_COMMUNITY): Payer: Self-pay

## 2014-04-06 ENCOUNTER — Ambulatory Visit (HOSPITAL_COMMUNITY)
Admission: RE | Admit: 2014-04-06 | Discharge: 2014-04-06 | Disposition: A | Discharge status: Home / Self Care | Payer: Medicare HMO | Source: Ambulatory Visit | Attending: Internal Medicine | Admitting: Internal Medicine

## 2014-04-06 VITALS — BP 92/70 | HR 74 | Resp 18 | Wt 166.1 lb

## 2014-04-06 DIAGNOSIS — R0602 Shortness of breath: Secondary | ICD-10-CM | POA: Diagnosis not present

## 2014-04-06 DIAGNOSIS — Z79891 Long term (current) use of opiate analgesic: Secondary | ICD-10-CM | POA: Diagnosis not present

## 2014-04-06 DIAGNOSIS — Z79899 Other long term (current) drug therapy: Secondary | ICD-10-CM | POA: Insufficient documentation

## 2014-04-06 DIAGNOSIS — I5022 Chronic systolic (congestive) heart failure: Secondary | ICD-10-CM | POA: Insufficient documentation

## 2014-04-06 DIAGNOSIS — Z87891 Personal history of nicotine dependence: Secondary | ICD-10-CM | POA: Diagnosis not present

## 2014-04-06 DIAGNOSIS — Z8673 Personal history of transient ischemic attack (TIA), and cerebral infarction without residual deficits: Secondary | ICD-10-CM | POA: Diagnosis not present

## 2014-04-06 DIAGNOSIS — E785 Hyperlipidemia, unspecified: Secondary | ICD-10-CM | POA: Insufficient documentation

## 2014-04-06 DIAGNOSIS — R609 Edema, unspecified: Secondary | ICD-10-CM | POA: Diagnosis not present

## 2014-04-06 DIAGNOSIS — F1910 Other psychoactive substance abuse, uncomplicated: Secondary | ICD-10-CM | POA: Insufficient documentation

## 2014-04-06 DIAGNOSIS — I429 Cardiomyopathy, unspecified: Secondary | ICD-10-CM | POA: Insufficient documentation

## 2014-04-06 DIAGNOSIS — Z7982 Long term (current) use of aspirin: Secondary | ICD-10-CM | POA: Diagnosis not present

## 2014-04-06 DIAGNOSIS — I340 Nonrheumatic mitral (valve) insufficiency: Secondary | ICD-10-CM | POA: Diagnosis not present

## 2014-04-06 DIAGNOSIS — F1021 Alcohol dependence, in remission: Secondary | ICD-10-CM | POA: Insufficient documentation

## 2014-04-06 DIAGNOSIS — Z720 Tobacco use: Secondary | ICD-10-CM

## 2014-04-06 DIAGNOSIS — F1010 Alcohol abuse, uncomplicated: Secondary | ICD-10-CM

## 2014-04-06 LAB — BASIC METABOLIC PANEL
Anion gap: 10 (ref 5–15)
BUN: 15 mg/dL (ref 6–23)
CHLORIDE: 104 meq/L (ref 96–112)
CO2: 27 meq/L (ref 19–32)
CREATININE: 1.13 mg/dL (ref 0.50–1.35)
Calcium: 9 mg/dL (ref 8.4–10.5)
GFR calc Af Amer: 72 mL/min — ABNORMAL LOW (ref >90)
GFR calc non Af Amer: 62 mL/min — ABNORMAL LOW (ref >90)
GLUCOSE: 74 mg/dL (ref 70–99)
POTASSIUM: 4.4 meq/L (ref 3.7–5.3)
Sodium: 141 mEq/L (ref 137–147)

## 2014-04-06 LAB — PRO B NATRIURETIC PEPTIDE: Pro B Natriuretic peptide (BNP): 12619 pg/mL — ABNORMAL HIGH (ref 0–125)

## 2014-04-06 MED ORDER — METOLAZONE 2.5 MG PO TABS: ORAL_TABLET | ORAL | Status: DC

## 2014-04-06 MED ORDER — CARVEDILOL 6.25 MG PO TABS: 6.2500 mg | ORAL_TABLET | Freq: Two times a day (BID) | ORAL | Status: AC

## 2014-04-06 MED ORDER — DIGOXIN 125 MCG PO TABS: 0.1250 mg | ORAL_TABLET | Freq: Every day | ORAL | Status: AC

## 2014-04-06 MED ORDER — FOSINOPRIL SODIUM 20 MG PO TABS: 20.0000 mg | ORAL_TABLET | Freq: Every day | ORAL | Status: DC

## 2014-04-06 NOTE — Progress Notes (Signed)
Patient ID: [name]., male   DOB: 1940-10-17, 73 y.o.   MRN: [id number]   Weight Range  158--160 pounds   Baseline proBNP   PCP: Dr Brigitte Pulse  HPI: Mr Delima is a 73 year old with history of nonischemic cardiomyopathy, chronic systolic heart failure dating back to 2009 with EF 5% 07/2013, prior stroke, gout, and polycythemia. He used to drink 1 pint of hard alcohol per day.   Admitted to [facility] 07/27/13  with increased dyspnea. LHC showed normal coronaries.  He was admitted post cath due to elevated filling pressures on RHC. PICC was placed and initial CO-OX was 78%. Prior to discharge he was agitated and there was a concern for ETOH withdrawal. Palliative Care met with him and he elected to become DNR/DNI.   Follow up for Heart Failure: About 2 weeks ago was having increased SOB and LE edema. Weight was up 3 lbs and took 2.5 mg metolazone and came by clinic a few days later for labs. At appt creatinine elevated and SBP 70s so medications held and cut back coreg, fosinopril and hydralazine. Doing ok. Denies SOB, PND, orthopnea or CP. Denies dizziness/lightheadedness. Able to walk around the whole grocery store without stopping. Having more difficulty going upstairs. Following a low salt diet and drinking less than 2L a day. Weight at home 159-163 lbs.     Last echo in 5/15 showed EF 10% with severe LV dilation, normal RV size with mild to moderately decreased RV systolic function.   RHC/LHC 07/27/13  RA = 9  RV = 65/7/12  PA = 76/23 (49)  PCW = 34  Fick cardiac output/index = 3.5/1.9  Thermo CO/CI = 3.8/2.0  PVR = 4.5 WU  SVR = 2000  FA sat = 85% (checked twice)  PA sat = 51%, 54%  Normal Cors.   Labs (5/15): K 4.3, creatinine 1.17, digoxin 1.2  ROS: All systems negative except as listed in HPI, PMH and Problem List.  Past Medical History  Diagnosis Date  . Pneumonia 11/01  . Nonischemic cardiomyopathy     a. LHC (07/2013) normal cors   . Volume overload   . H/O: drug dependency    In the past., clean for greater than 20 years  . Ejection fraction < 50%     EF 10%, echo, June, 2009, LV 75 mm  . Mitral regurgitation     Mild by history  . Right ventricular dysfunction     Mild to moderate, echo, June, 2009  . Tobacco abuse   . Systolic CHF, chronic     a. NICM b. RHC (07/2013) RA 9, RV 65/7/12, PA 76/23 (49), PCWP 34, FIck CO/CI: 3.5 / 1.9 Thermo CO/CI: 3.8 / 2.0, PVR 4.5 WU, SVR 2000, FA 85%, PA 51 and 54% c. ECHO (10/2013) EF 10% with severe LV dilation, normal RV size with mild/mod decreased RV sys fx  . Substance abuse   . Stroke   . Polycythemia     Jak 2 Negative  . Dysrhythmia 07/2013    PVT  . Hyperkalemia 07/2013    Current Outpatient Prescriptions  Medication Sig Dispense Refill  . aspirin 81 MG tablet Take 81 mg by mouth daily.        . carvedilol (COREG) 12.5 MG tablet Take 0.5 tablets (6.25 mg total) by mouth 2 (two) times daily with a meal.  30 tablet  0  . colchicine 0.6 MG tablet Take 0.6 mg by mouth every 8 (eight) hours as needed (  for gout flares).      Marland Kitchen digoxin (LANOXIN) 0.125 MG tablet Take 1 tablet (0.125 mg total) by mouth daily.  30 tablet  6  . docusate sodium (COLACE) 100 MG capsule Take 100 mg by mouth at bedtime.      . fosinopril (MONOPRIL) 40 MG tablet Take 40 mg by mouth daily.      . furosemide (LASIX) 80 MG tablet Take 1 tablet (80 mg total) by mouth 2 (two) times daily.  60 tablet  11  . gabapentin (NEURONTIN) 100 MG capsule Take 1 capsule (100 mg total) by mouth 2 (two) times daily.  60 capsule  6  . hydrALAZINE (APRESOLINE) 25 MG tablet Take 0.5 tablets (12.5 mg total) by mouth every 8 (eight) hours.  90 tablet  6  . HYDROcodone-acetaminophen (NORCO/VICODIN) 5-325 MG per tablet Take 1 tablet by mouth every 6 (six) hours as needed for moderate pain.      . isosorbide mononitrate (IMDUR) 30 MG 24 hr tablet Take 1 tablet (30 mg total) by mouth daily.  30 tablet  3  . metolazone (ZAROXOLYN) 2.5 MG tablet Take one by mouth Sat and Sun  30 min prior to his Furosemide dose  2 tablet  0  . nitroGLYCERIN (NITROSTAT) 0.4 MG SL tablet Place 1 tablet (0.4 mg total) under the tongue every 5 (five) minutes as needed for chest pain.  25 tablet  3  . potassium chloride SA (K-DUR,KLOR-CON) 20 MEQ tablet Take 1 tablet (20 mEq total) by mouth daily.  30 tablet  6  . rosuvastatin (CRESTOR) 40 MG tablet Take 40 mg by mouth daily.       . tamsulosin (FLOMAX) 0.4 MG CAPS capsule Take 1 capsule (0.4 mg total) by mouth daily.  30 capsule  0  . fluticasone (FLONASE) 50 MCG/ACT nasal spray Place 2 sprays into the nose at bedtime.        No current facility-administered medications for this encounter.    Filed Vitals:   04/06/14 1419  BP: 92/70  Pulse: 74  Resp: 18  Weight: 166 lb 2 oz (75.354 kg)  SpO2: 97%   PHYSICAL EXAM: General:  Elderly appearing. No resp difficulty, friend present HEENT: normal Neck: supple. JVP 7-8. Carotids 2+ bilaterally; no bruits. No lymphadenopathy or thryomegaly appreciated. Cor: PMI normal. Brady Regular rate & rhythm. No rubs, gallops or murmurs. Lungs: clear Abdomen: soft, nontender, mild distention. No hepatosplenomegaly. No bruits or masses. Good bowel sounds. Extremities: no cyanosis, clubbing, rash, edema Neuro: alert & orientedx3, cranial nerves grossly intact. Moves all 4 extremities w/o difficulty. Affect pleasant.  ASSESSMENT & PLAN:  1. Chronic Systolic Heart Failure: Nonischemic cardiomyopathy, EF 10% on 5/15 echo with severely dilated LV. Cardiomyopathy may be due to prior heavy ETOH.  He no longer drinks.   - NYHA II-III symptoms and is only mildly volume overloaded. Will continue lasix 80 mg BID and discussed calling if weight starts trending up or he gets SOB. Check BMET and pro-BNP today. - Had to cut coreg back to 6.25 mg BID d/t hypotension at lab visit. SBP stable will continue to follow.  - Continue fosinopril 20 mg daily, hydralazine 12.5 mg TID and Imdur 30 mg daily.  - Restart  digoxin 0.125 mg daily sent new Rx for patient.  - Refilled his medication boxes and will set up for paramedicine to see patient to help with medication management now that he is not with hospice.  - We discussed CRT-D again today.  He is not interested in it at this time.  - Reinforced the need and importance of daily weights, a low sodium diet, and fluid restriction (less than 2 L a day). Instructed to call the HF clinic if weight increases more than 3 lbs overnight or 5 lbs in a week.  2. ETOH abuse: Patient has quit drinking.  3. Hyperlipidemia: Continue Crestor 4. History of smoking: No longer smokes.  Had abdominal US to screen for AA in 2008, was not present.     F/U 1 month Rande Brunt NP-C 04/06/2014

## 2014-04-06 NOTE — Patient Instructions (Signed)
Doing well.  On your bottles that have PRN that means you take those as needed.  On the pill bottle that has a star * then you take that medication at night (not in pill box)  On the bottle that has X on it then you take at lunch (hydralazine 12.5 mg 1/2 tablet)  Will reorder your digoxin 0.125 mg daily, when you get this one put in your pill box.  Will reorder your coreg 6.25 mg tablets and then you will take 1 tablet (in the morning) and 1 tablet (in the evening). Already have filled your pill boxes but they are half tablets.  Reordered your metolazone. Take these only as needed when have extra fluid.  Follow up in 1 month.  Do the following things EVERYDAY: 1) Weigh yourself in the morning before breakfast. Write it down and keep it in a log. 2) Take your medicines as prescribed 3) Eat low salt foods-Limit salt (sodium) to 2000 mg per day.  4) Stay as active as you can everyday 5) Limit all fluids for the day to less than 2 liters 6)

## 2014-04-12 ENCOUNTER — Telehealth (HOSPITAL_COMMUNITY): Payer: Self-pay

## 2014-04-12 NOTE — Telephone Encounter (Signed)
Lab results reviewed with patient, instructed to take metolazone only one day per week.  Aware and agreeable.

## 2014-04-20 ENCOUNTER — Ambulatory Visit (INDEPENDENT_AMBULATORY_CARE_PROVIDER_SITE_OTHER): Payer: Commercial Managed Care - HMO | Admitting: Cardiology

## 2014-04-20 ENCOUNTER — Encounter: Payer: Self-pay | Admitting: Cardiology

## 2014-04-20 ENCOUNTER — Encounter (HOSPITAL_COMMUNITY): Payer: Commercial Managed Care - HMO

## 2014-04-20 VITALS — BP 58/48 | HR 77 | Ht 69.5 in | Wt 157.4 lb

## 2014-04-20 DIAGNOSIS — I5022 Chronic systolic (congestive) heart failure: Secondary | ICD-10-CM

## 2014-04-20 DIAGNOSIS — I429 Cardiomyopathy, unspecified: Secondary | ICD-10-CM

## 2014-04-20 MED ORDER — HYDRALAZINE HCL 25 MG PO TABS: ORAL_TABLET | ORAL | Status: AC

## 2014-04-20 MED ORDER — ISOSORBIDE MONONITRATE ER 30 MG PO TB24: ORAL_TABLET | ORAL | Status: AC

## 2014-04-20 NOTE — Assessment & Plan Note (Addendum)
His volume status is stable. No change in therapy. At this time his management is through the heart failure clinic. I will be happy to see him at any time. However heart failure is the only cardiac problem that I follow him for. I will not plan to see him back until the heart failure team feels that I should.

## 2014-04-20 NOTE — Assessment & Plan Note (Signed)
Patient has severe cardiomyopathy. He is being managed very carefully through the advanced heart failure clinic. Unfortunately his blood pressure is too low today. He is not having significant symptoms. I decided to hold his hydralazine and Imdur. They are being used at low doses at this time. He will remain off of these meds until he seen back in the heart failure clinic soon.

## 2014-04-20 NOTE — Assessment & Plan Note (Signed)
His volume status is stable today. No change in therapy.

## 2014-04-20 NOTE — Progress Notes (Signed)
Patient ID: Paul Dartt., male   DOB: 15-Jan-1941, 73 y.o.   MRN: [id number]    HPI   The patient is seen for cardiology follow-up. He has significant cardiomyopathy. I have followed him for many years. He has not been interested in an ICD in the past. Recently he was hospitalized and he is being managed by the heart failure team. The history of significant alcohol became more obvious with this last admission. That was not information that I had had over the years. He has severe left ventricular dysfunction. His medications have been titrated very aggressively by the heart failure team. He did have an episode with low blood pressure. This was noted while he was in the office. He tells me that his meds were held briefly and then restarted. Today he feels well. However his blood pressure is very low.  No Known Allergies  Current Outpatient Prescriptions  Medication Sig Dispense Refill  . aspirin 81 MG tablet Take 81 mg by mouth daily.      . carvedilol (COREG) 6.25 MG tablet Take 1 tablet (6.25 mg total) by mouth 2 (two) times daily with a meal. 60 tablet 3  . colchicine 0.6 MG tablet Take 0.6 mg by mouth every 8 (eight) hours as needed (for gout flares).    Marland Kitchen digoxin (LANOXIN) 0.125 MG tablet Take 1 tablet (0.125 mg total) by mouth daily. 30 tablet 6  . docusate sodium (COLACE) 100 MG capsule Take 100 mg by mouth at bedtime.    . fluticasone (FLONASE) 50 MCG/ACT nasal spray Place 2 sprays into the nose at bedtime.     . fosinopril (MONOPRIL) 20 MG tablet Take 1 tablet (20 mg total) by mouth daily. 30 tablet 3  . furosemide (LASIX) 80 MG tablet Take 1 tablet (80 mg total) by mouth 2 (two) times daily. 60 tablet 11  . hydrALAZINE (APRESOLINE) 25 MG tablet On hold until seen in heart failure clinic 90 tablet 6  . isosorbide mononitrate (IMDUR) 30 MG 24 hr tablet On hold until seen in heart failure clinic 30 tablet 3  . metolazone (ZAROXOLYN) 2.5 MG tablet Take 2.5 mg by mouth once a week.    .  nitroGLYCERIN (NITROSTAT) 0.4 MG SL tablet Place 1 tablet (0.4 mg total) under the tongue every 5 (five) minutes as needed for chest pain. 25 tablet 3  . potassium chloride SA (K-DUR,KLOR-CON) 20 MEQ tablet Take 1 tablet (20 mEq total) by mouth daily. 30 tablet 6  . rosuvastatin (CRESTOR) 40 MG tablet Take 40 mg by mouth daily.     . tamsulosin (FLOMAX) 0.4 MG CAPS capsule Take 1 capsule (0.4 mg total) by mouth daily. 30 capsule 0   No current facility-administered medications for this visit.    History   Social History  . Marital Status: Widowed    Spouse Name: N/A    Number of Children: 3  . Years of Education: N/A   Occupational History  . retired Training and development officer    Social History Main Topics  . Smoking status: Former Smoker    Types: Cigarettes    Quit date: 06/17/2010  . Smokeless tobacco: Never Used  . Alcohol Use: 1.5 oz/week    3 drink(s) per week  . Drug Use: No     Comment: hx of clean for 20+ years  . Sexual Activity: Not on file   Other Topics Concern  . Not on file   Social History Narrative    Family History  Problem Relation  Age of Onset  . Diabetes Mother   . Colon cancer Neg Hx   . Esophageal cancer Neg Hx   . Stomach cancer Neg Hx   . Rectal cancer Neg Hx     Past Medical History  Diagnosis Date  . Pneumonia 11/01  . Nonischemic cardiomyopathy     a. LHC (07/2013) normal cors   . Volume overload   . H/O: drug dependency     In the past., clean for greater than 20 years  . Ejection fraction < 50%     EF 10%, echo, June, 2009, LV 75 mm  . Mitral regurgitation     Mild by history  . Right ventricular dysfunction     Mild to moderate, echo, June, 2009  . Tobacco abuse   . Systolic CHF, chronic     a. NICM b. RHC (07/2013) RA 9, RV 65/7/12, PA 76/23 (49), PCWP 34, FIck CO/CI: 3.5 / 1.9 Thermo CO/CI: 3.8 / 2.0, PVR 4.5 WU, SVR 2000, FA 85%, PA 51 and 54% c. ECHO (10/2013) EF 10% with severe LV dilation, normal RV size with mild/mod decreased RV sys fx  .  Substance abuse   . Stroke   . Polycythemia     Jak 2 Negative  . Dysrhythmia 07/2013    PVT  . Hyperkalemia 07/2013    Past Surgical History  Procedure Laterality Date  . Colonoscopy    . Ingunial hernia repair x2    . Hernia repair      Patient Active Problem List   Diagnosis Date Noted  . Chronic systolic heart failure 70/62/3762  . Gout 08/05/2013  . Alcohol abuse 08/05/2013  . Palliative care encounter 07/29/2013  . DNR (do not resuscitate) 07/29/2013  . Acute on chronic systolic heart failure 83/15/1761  . Paroxysmal ventricular tachycardia 07/23/2013  . Right arm pain 07/23/2013  . Hyperkalemia 07/22/2013  . Systolic CHF, chronic   . Cardiomyopathy   . Ejection fraction < 50%   . Mitral regurgitation   . Right ventricular dysfunction   . Tobacco abuse   . POLYCYTHEMIA 04/14/2009    ROS   patient denies fever, chills, headache, sweats, rash, change in vision, change in hearing, chest pain, cough, nausea or vomiting, urinary symptoms. All other systems are reviewed and are negative.  PHYSICAL EXAM  patient is here with a family member. He is oriented to person time and place. Affect is normal. Blood pressure is extremely low. He insists that he does not have any significant symptoms. Head is atraumatic. Sclera and conjunctiva are normal. Lungs are clear. Respiratory effort is not labored. Cardiac exam reveals an S1 and S2. Abdomen is soft. There is no peripheral edema.  Filed Vitals:   04/20/14 1043  BP: 58/48  Pulse: 77  Height: 5' 9.5" (1.765 m)  Weight: 157 lb 6.4 oz (71.396 kg)     ASSESSMENT & PLAN

## 2014-04-20 NOTE — Patient Instructions (Addendum)
Your physician has recommended you make the following change in your medication: stop taking Imdur and Hydralazine (hold) until you are seen in our heart failure clinic.   Your physician recommends that you schedule a follow-up appointment in: since you are being seen in the heart failure clinic you will only need to follow up with Dr Ron Parker as needed.

## 2014-05-09 ENCOUNTER — Ambulatory Visit (HOSPITAL_COMMUNITY)
Admission: RE | Admit: 2014-05-09 | Discharge: 2014-05-09 | Disposition: A | Discharge status: Home / Self Care | Payer: Medicare HMO | Source: Ambulatory Visit | Attending: Internal Medicine | Admitting: Internal Medicine

## 2014-05-09 ENCOUNTER — Encounter (HOSPITAL_COMMUNITY): Payer: Self-pay

## 2014-05-09 ENCOUNTER — Telehealth (HOSPITAL_COMMUNITY): Payer: Self-pay

## 2014-05-09 VITALS — BP 96/70 | HR 78 | Resp 18 | Wt 159.4 lb

## 2014-05-09 DIAGNOSIS — I5022 Chronic systolic (congestive) heart failure: Secondary | ICD-10-CM | POA: Diagnosis present

## 2014-05-09 DIAGNOSIS — F1010 Alcohol abuse, uncomplicated: Secondary | ICD-10-CM | POA: Diagnosis not present

## 2014-05-09 DIAGNOSIS — Z66 Do not resuscitate: Secondary | ICD-10-CM | POA: Diagnosis not present

## 2014-05-09 DIAGNOSIS — E785 Hyperlipidemia, unspecified: Secondary | ICD-10-CM | POA: Diagnosis not present

## 2014-05-09 DIAGNOSIS — Z87891 Personal history of nicotine dependence: Secondary | ICD-10-CM | POA: Diagnosis not present

## 2014-05-09 LAB — DIGOXIN LEVEL: Digoxin Level: 1.1 ng/mL (ref 0.8–2.0)

## 2014-05-09 LAB — BASIC METABOLIC PANEL
ANION GAP: 9 (ref 5–15)
BUN: 18 mg/dL (ref 6–23)
CHLORIDE: 99 meq/L (ref 96–112)
CO2: 33 meq/L — AB (ref 19–32)
Calcium: 9.8 mg/dL (ref 8.4–10.5)
Creatinine, Ser: 1.32 mg/dL (ref 0.50–1.35)
GFR calc Af Amer: 60 mL/min — ABNORMAL LOW (ref >90)
GFR calc non Af Amer: 51 mL/min — ABNORMAL LOW (ref >90)
GLUCOSE: 121 mg/dL — AB (ref 70–99)
POTASSIUM: 4.1 meq/L (ref 3.7–5.3)
SODIUM: 141 meq/L (ref 137–147)

## 2014-05-09 MED ORDER — FOSINOPRIL SODIUM 20 MG PO TABS: 40.0000 mg | ORAL_TABLET | Freq: Every day | ORAL | Status: DC

## 2014-05-09 MED ORDER — FOSINOPRIL SODIUM 20 MG PO TABS: 20.0000 mg | ORAL_TABLET | Freq: Every day | ORAL | Status: AC

## 2014-05-09 NOTE — Progress Notes (Signed)
Patient ID: [name]., male   DOB: 1941-04-04, 73 y.o.   MRN: [id number]   Weight Range  158--160 pounds   Baseline proBNP   PCP: Dr Brigitte Pulse DNR   HPI: [name] is a 73 year old with history of nonischemic cardiomyopathy, chronic systolic heart failure dating back to 2009 with EF 5% 07/2013, prior stroke, gout, and polycythemia. He used to drink 1 pint of hard alcohol per day.   Admitted to [facility] 07/27/13  with increased dyspnea. LHC showed normal coronaries.  He was admitted post cath due to elevated filling pressures on RHC. PICC was placed and initial CO-OX was 78%. Prior to discharge he was agitated and there was a concern for ETOH withdrawal. Palliative Care met with him and he elected to become DNR/DNI.   Follow up for Heart Failure: Since the last visit he was evaluated by Dr Ron Parker and hydralazine/imdur stopped due to hypotension. Complaining of dizziness. He has  been taking 40 mg of fosinopril daily instead of 20 mg daily. Complaining of congestion with yellow sputum. Complaining of fatigue. Weight at home 158-160 pounds.  Having more difficulty going upstairs. Following a low salt diet and drinking less than 2L a day. Weight at home 159-163 lbs.   He has not had any alcohol since March.   Last echo in 5/15 showed EF 10% with severe LV dilation, normal RV size with mild to moderately decreased RV systolic function.   RHC/LHC 07/27/13  RA = 9  RV = 65/7/12  PA = 76/23 (49)  PCW = 34  Fick cardiac output/index = 3.5/1.9  Thermo CO/CI = 3.8/2.0  PVR = 4.5 WU  SVR = 2000  FA sat = 85% (checked twice)  PA sat = 51%, 54%  Normal Cors.   Labs (5/15): K 4.3, creatinine 1.17, digoxin 1.2 Labs (01/03/14) Dig level 1.1 Labs 04/06/14 K 4.4 Creatinine 1.13 Pro BNP 12619   ECHO 11/02/13 EF 10%   ROS: All systems negative except as listed in HPI, PMH and Problem List.  Past Medical History  Diagnosis Date  . Pneumonia 11/01  . Nonischemic cardiomyopathy     a. LHC (07/2013) normal cors    . Volume overload   . H/O: drug dependency     In the past., clean for greater than 20 years  . Ejection fraction < 50%     EF 10%, echo, June, 2009, LV 75 mm  . Mitral regurgitation     Mild by history  . Right ventricular dysfunction     Mild to moderate, echo, June, 2009  . Tobacco abuse   . Systolic CHF, chronic     a. NICM b. RHC (07/2013) RA 9, RV 65/7/12, PA 76/23 (49), PCWP 34, FIck CO/CI: 3.5 / 1.9 Thermo CO/CI: 3.8 / 2.0, PVR 4.5 WU, SVR 2000, FA 85%, PA 51 and 54% c. ECHO (10/2013) EF 10% with severe LV dilation, normal RV size with mild/mod decreased RV sys fx  . Substance abuse   . Stroke   . Polycythemia     Jak 2 Negative  . Dysrhythmia 07/2013    PVT  . Hyperkalemia 07/2013    Current Outpatient Prescriptions  Medication Sig Dispense Refill  . aspirin 81 MG tablet Take 81 mg by mouth daily.      . carvedilol (COREG) 6.25 MG tablet Take 1 tablet (6.25 mg total) by mouth 2 (two) times daily with a meal. 60 tablet 3  . colchicine 0.6 MG tablet Take  0.6 mg by mouth every 8 (eight) hours as needed (for gout flares).    Marland Kitchen digoxin (LANOXIN) 0.125 MG tablet Take 1 tablet (0.125 mg total) by mouth daily. 30 tablet 6  . docusate sodium (COLACE) 100 MG capsule Take 100 mg by mouth at bedtime.    . fluticasone (FLONASE) 50 MCG/ACT nasal spray Place 2 sprays into the nose at bedtime.     . fosinopril (MONOPRIL) 20 MG tablet Take 2 tablets (40 mg total) by mouth daily. 30 tablet 3  . furosemide (LASIX) 80 MG tablet Take 1 tablet (80 mg total) by mouth 2 (two) times daily. 60 tablet 11  . gabapentin (NEURONTIN) 100 MG capsule Take 100 mg by mouth 2 (two) times daily.    . isosorbide mononitrate (IMDUR) 30 MG 24 hr tablet On hold until seen in heart failure clinic 30 tablet 3  . metolazone (ZAROXOLYN) 2.5 MG tablet Take 2.5 mg by mouth once a week.    . nitroGLYCERIN (NITROSTAT) 0.4 MG SL tablet Place 1 tablet (0.4 mg total) under the tongue every 5 (five) minutes as needed for chest  pain. 25 tablet 3  . potassium chloride SA (K-DUR,KLOR-CON) 20 MEQ tablet Take 1 tablet (20 mEq total) by mouth daily. 30 tablet 6  . rosuvastatin (CRESTOR) 40 MG tablet Take 40 mg by mouth daily.     . tamsulosin (FLOMAX) 0.4 MG CAPS capsule Take 1 capsule (0.4 mg total) by mouth daily. 30 capsule 0  . hydrALAZINE (APRESOLINE) 25 MG tablet On hold until seen in heart failure clinic (Patient not taking: Reported on 05/09/2014) 90 tablet 6   No current facility-administered medications for this encounter.    Filed Vitals:   05/09/14 1153  BP: 96/70  Pulse: 78  Resp: 18  Weight: 159 lb 6 oz (72.292 kg)  SpO2: 98%   PHYSICAL EXAM: General:  Elderly appearing. No resp difficulty, friend present HEENT: normal Neck: supple. JVP flat. Carotids 2+ bilaterally; no bruits. No lymphadenopathy or thryomegaly appreciated. Cor: PMI normal. Brady Regular rate & rhythm. No rubs, gallops or murmurs. Lungs: clear Abdomen: soft, nontender, mild distention. No hepatosplenomegaly. No bruits or masses. Good bowel sounds. Extremities: no cyanosis, clubbing, rash, edema Neuro: alert & orientedx3, cranial nerves grossly intact. Moves all 4 extremities w/o difficulty. Affect pleasant.  EKG: SR wit PVCs 77 bpm   ASSESSMENT & PLAN:  1. Chronic Systolic Heart Failure: Nonischemic cardiomyopathy, EF 10% on 5/15 echo with severely dilated LV. Cardiomyopathy may be due to prior heavy ETOH. Stopped drinking 8 months ago. If EF is low he is adamant he does not want  CRT-D    - NYHA II-III symptoms . Complaining of dizziness and fatigue but not surprising as he was taking twice a much fosinopril .  Volume status low. Hold lasix for 2 days then restart lasix 80 mg twice a day and weekly metolazone 2.5.  Hold potassium today and tomorrow the restart 20 meq daily.  - Continue coreg 6.25 mg twice a day -At his request I have removed extra fosinopril bottle from his medication bad and reinforced to only take 20 mg daily   -Keep off hydralalzine/imdur for now due to soft BP.  - Check dig level. Continue dgoxin 0.125 mg daily.  -- Reinforced the need and importance of daily weights, a low sodium diet, and fluid restriction (less than 2 L a day). Instructed to call the HF clinic if weight increases more than 3 lbs overnight or 5 lbs in  a week.  2. ETOH abuse: Quit in drinking in March 2015.   3. Hyperlipidemia: Continue Crestor 4. History of smoking: No longer smokes.  Had abdominal US to screen for AA in 2008, was not present.   5. DNR    Follow up in 2 weeks with ECHO  CLEGG,AMY NP-C 05/09/2014

## 2014-05-09 NOTE — Patient Instructions (Signed)
Follow up in 2 weeks with an echocardiogram.  HOLD lasix and potassium for 2 days.  Happy Thanksgiving!  Do the following things EVERYDAY: 1) Weigh yourself in the morning before breakfast. Write it down and keep it in a log. 2) Take your medicines as prescribed 3) Eat low salt foods-Limit salt (sodium) to 2000 mg per day.  4) Stay as active as you can everyday 5) Limit all fluids for the day to less than 2 liters

## 2014-05-09 NOTE — Telephone Encounter (Signed)
Dig level elevated 1.1, instructed per Amy Clegg NP-C to decrease dosage to 0.125 EVERY OTHER DAY.  Patient aware and agreeable.  Patient wrote on bottle new instructions.  Renee Pain

## 2014-05-17 DEATH — deceased

## 2014-05-26 ENCOUNTER — Encounter (HOSPITAL_COMMUNITY): Payer: Self-pay | Admitting: Internal Medicine

## 2014-05-26 ENCOUNTER — Encounter (HOSPITAL_COMMUNITY): Payer: Commercial Managed Care - HMO

## 2014-05-26 ENCOUNTER — Other Ambulatory Visit (HOSPITAL_COMMUNITY): Payer: Commercial Managed Care - HMO
